# Patient Record
Sex: Female | Born: 1965 | Race: White | Hispanic: No | Marital: Married | Smoking: Current every day smoker
Health system: Southern US, Community
[De-identification: ages and names within clinical notes are randomized; demographics above are authoritative.]

## PROBLEM LIST (undated history)

## (undated) DIAGNOSIS — I10 Essential (primary) hypertension: Secondary | ICD-10-CM

## (undated) DIAGNOSIS — I219 Acute myocardial infarction, unspecified: Secondary | ICD-10-CM

## (undated) DIAGNOSIS — K219 Gastro-esophageal reflux disease without esophagitis: Secondary | ICD-10-CM

## (undated) DIAGNOSIS — E119 Type 2 diabetes mellitus without complications: Secondary | ICD-10-CM

## (undated) DIAGNOSIS — K8020 Calculus of gallbladder without cholecystitis without obstruction: Secondary | ICD-10-CM

## (undated) HISTORY — PX: WISDOM TOOTH EXTRACTION: SHX21

## (undated) HISTORY — DX: Acute myocardial infarction, unspecified: I21.9

## (undated) HISTORY — DX: Type 2 diabetes mellitus without complications: E11.9

---

## 2004-12-05 ENCOUNTER — Emergency Department: Payer: Self-pay | Admitting: Emergency Medicine

## 2005-05-27 ENCOUNTER — Ambulatory Visit: Payer: Self-pay | Admitting: Obstetrics and Gynecology

## 2005-10-28 ENCOUNTER — Ambulatory Visit: Payer: Self-pay | Admitting: Internal Medicine

## 2005-11-28 ENCOUNTER — Ambulatory Visit: Payer: Self-pay | Admitting: Internal Medicine

## 2006-01-03 ENCOUNTER — Ambulatory Visit: Payer: Self-pay | Admitting: Internal Medicine

## 2006-01-28 ENCOUNTER — Ambulatory Visit: Payer: Self-pay | Admitting: Internal Medicine

## 2006-03-04 ENCOUNTER — Ambulatory Visit: Payer: Self-pay | Admitting: Internal Medicine

## 2006-03-30 ENCOUNTER — Ambulatory Visit: Payer: Self-pay | Admitting: Internal Medicine

## 2008-08-14 ENCOUNTER — Ambulatory Visit: Payer: Self-pay | Admitting: Family Medicine

## 2009-11-17 ENCOUNTER — Emergency Department: Payer: Self-pay | Admitting: Emergency Medicine

## 2009-11-21 ENCOUNTER — Emergency Department: Payer: Self-pay | Admitting: Emergency Medicine

## 2009-11-24 ENCOUNTER — Emergency Department: Payer: Self-pay | Admitting: Emergency Medicine

## 2010-07-08 ENCOUNTER — Ambulatory Visit: Payer: Self-pay | Admitting: Internal Medicine

## 2010-07-20 ENCOUNTER — Ambulatory Visit: Payer: Self-pay | Admitting: Internal Medicine

## 2010-07-30 ENCOUNTER — Ambulatory Visit: Payer: Self-pay | Admitting: Internal Medicine

## 2010-08-30 ENCOUNTER — Ambulatory Visit: Payer: Self-pay | Admitting: Internal Medicine

## 2011-11-09 ENCOUNTER — Ambulatory Visit: Payer: Self-pay | Admitting: Sports Medicine

## 2013-01-21 ENCOUNTER — Emergency Department: Payer: Self-pay | Admitting: Emergency Medicine

## 2016-05-18 ENCOUNTER — Encounter: Payer: Self-pay | Admitting: *Deleted

## 2016-05-27 ENCOUNTER — Encounter: Payer: Self-pay | Admitting: *Deleted

## 2016-06-03 ENCOUNTER — Ambulatory Visit: Payer: Self-pay | Admitting: General Surgery

## 2016-06-24 ENCOUNTER — Encounter: Payer: Self-pay | Admitting: *Deleted

## 2019-07-10 ENCOUNTER — Ambulatory Visit (INDEPENDENT_AMBULATORY_CARE_PROVIDER_SITE_OTHER): Payer: BC Managed Care – PPO | Admitting: Obstetrics & Gynecology

## 2019-07-10 ENCOUNTER — Other Ambulatory Visit (HOSPITAL_COMMUNITY)
Admission: RE | Admit: 2019-07-10 | Discharge: 2019-07-10 | Disposition: A | Discharge status: Home / Self Care | Payer: BC Managed Care – PPO | Source: Ambulatory Visit | Attending: Obstetrics & Gynecology | Admitting: Obstetrics & Gynecology

## 2019-07-10 ENCOUNTER — Encounter: Payer: Self-pay | Admitting: Obstetrics & Gynecology

## 2019-07-10 ENCOUNTER — Other Ambulatory Visit: Payer: Self-pay

## 2019-07-10 VITALS — BP 150/90 | Ht 65.0 in | Wt 184.0 lb

## 2019-07-10 DIAGNOSIS — Z1211 Encounter for screening for malignant neoplasm of colon: Secondary | ICD-10-CM

## 2019-07-10 DIAGNOSIS — Z01419 Encounter for gynecological examination (general) (routine) without abnormal findings: Secondary | ICD-10-CM | POA: Diagnosis not present

## 2019-07-10 DIAGNOSIS — Z23 Encounter for immunization: Secondary | ICD-10-CM

## 2019-07-10 DIAGNOSIS — Z1231 Encounter for screening mammogram for malignant neoplasm of breast: Secondary | ICD-10-CM

## 2019-07-10 DIAGNOSIS — Z124 Encounter for screening for malignant neoplasm of cervix: Secondary | ICD-10-CM

## 2019-07-10 DIAGNOSIS — N952 Postmenopausal atrophic vaginitis: Secondary | ICD-10-CM | POA: Insufficient documentation

## 2019-07-10 DIAGNOSIS — N9419 Other specified dyspareunia: Secondary | ICD-10-CM | POA: Insufficient documentation

## 2019-07-10 NOTE — Patient Instructions (Addendum)
PAP every three years    Done today Mammogram every year    Call [phone] to schedule at [facility] Colonoscopy every 10 years    Someone will call to schedule consult Labs yearly (with PCP)   Atrophic Vaginitis  Atrophic vaginitis is a condition in which the tissues that line the vagina become dry and thin. This condition is most common in women who have stopped having regular menstrual periods (are in menopause). This usually starts when a woman is 61-68 years old. That is the time when a woman's estrogen levels begin to drop (decrease). Estrogen is a female hormone. It helps to keep the tissues of the vagina moist. It stimulates the vagina to produce a clear fluid that lubricates the vagina for sexual intercourse. This fluid also protects the vagina from infection. Lack of estrogen can cause the lining of the vagina to get thinner and dryer. The vagina may also shrink in size. It may become less elastic. Atrophic vaginitis tends to get worse over time as a woman's estrogen level drops. What are the causes? This condition is caused by the normal drop in estrogen that happens around the time of menopause. What increases the risk? Certain conditions or situations may lower a woman's estrogen level, leading to a higher risk for atrophic vaginitis. You are more likely to develop this condition if:  You are taking medicines that block estrogen.  You have had your ovaries removed.  You are being treated for cancer with X-ray (radiation) or medicines (chemotherapy).  You have given birth or are breastfeeding.  You are older than age 66.  You smoke. What are the signs or symptoms? Symptoms of this condition include:  Pain, soreness, or bleeding during sexual intercourse (dyspareunia).  Vaginal burning, irritation, or itching.  Pain or bleeding when a speculum is used in a vaginal exam (pelvic exam).  Having burning pain when passing urine.  Vaginal discharge that is brown or  yellow. In some cases, there are no symptoms. How is this diagnosed? This condition is diagnosed by taking a medical history and doing a physical exam. This will include a pelvic exam that checks the vaginal tissues. Though rare, you may also have other tests, including:  A urine test.  A test that checks the acid balance in your vagina (acid balance test). How is this treated? Treatment for this condition depends on how severe your symptoms are. Treatment may include:  Using an over-the-counter vaginal lubricant before sex.  Using a long-acting vaginal moisturizer. REPLENS  Using low-dose vaginal estrogen for moderate to severe symptoms that do not respond to other treatments. Options include creams, tablets, and inserts (vaginal rings). Before you use a vaginal estrogen, tell your health care provider if you have a history of: ? Breast cancer. ? Endometrial cancer. ? Blood clots. If you are not sexually active and your symptoms are very mild, you may not need treatment. Follow these instructions at home: Medicines  Take over-the-counter and prescription medicines only as told by your health care provider. Do not use herbal or alternative medicines unless your health care provider says that you can.  Use over-the-counter creams, lubricants, or moisturizers for dryness only as directed by your health care provider. General instructions  If your atrophic vaginitis is caused by menopause, discuss all of your menopause symptoms and treatment options with your health care provider.  Do not douche.  Do not use products that can make your vagina dry. These include: ? Scented feminine sprays. ? Scented  tampons. ? Scented soaps.  Vaginal intercourse can help to improve blood flow and elasticity of vaginal tissue. If it hurts to have sex, try using a lubricant or moisturizer just before having intercourse. Contact a health care provider if:  Your discharge looks different than  normal.  Your vagina has an unusual smell.  You have new symptoms.  Your symptoms do not improve with treatment.  Your symptoms get worse. Summary  Atrophic vaginitis is a condition in which the tissues that line the vagina become dry and thin. It is most common in women who have stopped having regular menstrual periods (are in menopause).  Treatment options include using vaginal lubricants and low-dose vaginal estrogen.  Contact a health care provider if your vagina has an unusual smell, or if your symptoms get worse or do not improve after treatment. This information is not intended to replace advice given to you by your health care provider. Make sure you discuss any questions you have with your health care provider. Document Released: 12/31/2014 Document Revised: 07/29/2017 Document Reviewed: 05/12/2017 Elsevier Patient Education  2020 [employer].

## 2019-07-10 NOTE — Progress Notes (Signed)
HPI:      [name] is a 53 y.o. who LMP was > 1 year ago, she presents today for her annual examination.  The patient has no complaints today other than developing dyspareunia for the past year especially vaginally and w entry.  Denies hot flashes or night sweats. The patient is sexually active. Herlast pap: was normal and this was years ago; and last mammogram: approximate date 2009 and was normal.  The patient does perform self breast exams.  There is no notable family history of breast or ovarian cancer in her family. The patient is not taking hormone replacement therapy. Patient denies post-menopausal vaginal bleeding.   The patient has regular exercise: yes. The patient denies current symptoms of depression.    GYN Hx: Last Colonoscopy:never     PMHx: Past Medical History:  Diagnosis Date  . Diabetes [facility])    Past Surgical History:  Procedure Laterality Date  . CESAREAN SECTION    . WISDOM TOOTH EXTRACTION     Family History  Family history unknown: Yes   Social History   Tobacco Use  . Smoking status: Current Every Day Smoker  . Smokeless tobacco: Never Used  Substance Use Topics  . Alcohol use: Never    Frequency: Never  . Drug use: Never    Current Outpatient Medications:  .  metFORMIN (GLUCOPHAGE) 850 MG tablet, Take 850 mg by mouth 2 (two) times daily with a meal., Disp: , Rfl:  Allergies: Patient has no allergy information on record.  Review of Systems  Constitutional: Negative for chills, fever and malaise/fatigue.  HENT: Negative for congestion, sinus pain and sore throat.   Eyes: Negative for blurred vision and pain.  Respiratory: Negative for cough and wheezing.   Cardiovascular: Negative for chest pain and leg swelling.  Gastrointestinal: Negative for abdominal pain, constipation, diarrhea, heartburn, nausea and vomiting.  Genitourinary: Negative for dysuria, frequency, hematuria and urgency.  Musculoskeletal: Negative for back pain, joint pain,  myalgias and neck pain.  Skin: Negative for itching and rash.  Neurological: Negative for dizziness, tremors and weakness.  Endo/Heme/Allergies: Does not bruise/bleed easily.  Psychiatric/Behavioral: Negative for depression. The patient is not nervous/anxious and does not have insomnia.     Objective: BP (!) 150/90   Ht 5\' 5"  (1.651 m)   Wt 184 lb (83.5 kg)   BMI 30.62 kg/m   Filed Weights   07/10/19 1013  Weight: 184 lb (83.5 kg)   Body mass index is 30.62 kg/m. Physical Exam Constitutional:      General: She is not in acute distress.    Appearance: She is well-developed.  Genitourinary:     Pelvic exam was performed with patient supine.     Vagina, uterus and rectum normal.     No lesions in the vagina.     No vaginal bleeding.     No cervical motion tenderness, friability, lesion or polyp.     Uterus is mobile.     Uterus is not enlarged.     No uterine mass detected.    Uterus is midaxial.     No right or left adnexal mass present.     Right adnexa not tender.     Left adnexa not tender.  HENT:     Head: Normocephalic and atraumatic. No laceration.     Right Ear: Hearing normal.     Left Ear: Hearing normal.     Mouth/Throat:     Pharynx: Uvula midline.  Eyes:  Pupils: Pupils are equal, round, and reactive to light.  Neck:     Musculoskeletal: Normal range of motion and neck supple.     Thyroid: No thyromegaly.  Cardiovascular:     Rate and Rhythm: Normal rate and regular rhythm.     Heart sounds: No murmur. No friction rub. No gallop.   Pulmonary:     Effort: Pulmonary effort is normal. No respiratory distress.     Breath sounds: Normal breath sounds. No wheezing.  Chest:     Breasts:        Right: No mass, skin change or tenderness.        Left: No mass, skin change or tenderness.  Abdominal:     General: Bowel sounds are normal. There is no distension.     Palpations: Abdomen is soft.     Tenderness: There is no abdominal tenderness. There is no  rebound.  Musculoskeletal: Normal range of motion.  Neurological:     Mental Status: She is alert and oriented to person, place, and time.     Cranial Nerves: No cranial nerve deficit.  Skin:    General: Skin is warm and dry.  Psychiatric:        Judgment: Judgment normal.  Vitals signs reviewed.     Assessment: Annual Exam 1. Women's annual routine gynecological examination   2. Screening for cervical cancer   3. Screen for colon cancer   4. Vaginal atrophy   5. Dyspareunia due to medical condition in female   6. Encounter for screening mammogram for malignant neoplasm of breast     Plan:            1.  Cervical Screening-  Pap smear done today Also monitor PAP results for infection  2. Breast screening- Exam annually and mammogram scheduled  3. Colonoscopy every 10 years, Hemoccult testing after age 25  4. Labs managed by PCP  5. Counseling for hormonal therapy: none Will try Replens for vag dryness and dyspareunia, options discussed also as to ERT and [place] and [place]              6. FRAX - FRAX score for assessing the 10 year probability for fracture calculated and discussed today.  Based on age and score today, DEXA is not currently scheduled.   7. Flu shot today    F/U  Return in about 1 year (around 07/09/2020) for Annual.  Barnett Applebaum, MD, [facility], [facility] 07/10/2019  10:35 AM

## 2019-07-13 LAB — CYTOLOGY - PAP
Adequacy: ABSENT
Comment: NEGATIVE
Diagnosis: NEGATIVE
High risk HPV: NEGATIVE

## 2019-07-20 ENCOUNTER — Encounter: Payer: Self-pay | Admitting: *Deleted

## 2019-09-03 ENCOUNTER — Other Ambulatory Visit: Payer: Self-pay | Admitting: Physical Medicine and Rehabilitation

## 2019-09-03 DIAGNOSIS — G8929 Other chronic pain: Secondary | ICD-10-CM

## 2019-09-03 DIAGNOSIS — M25511 Pain in right shoulder: Secondary | ICD-10-CM

## 2019-09-10 ENCOUNTER — Other Ambulatory Visit: Payer: Self-pay

## 2019-09-10 ENCOUNTER — Ambulatory Visit
Admission: RE | Admit: 2019-09-10 | Discharge: 2019-09-10 | Disposition: A | Discharge status: Home / Self Care | Payer: BC Managed Care – PPO | Source: Ambulatory Visit | Attending: Physical Medicine and Rehabilitation | Admitting: Physical Medicine and Rehabilitation

## 2019-09-10 DIAGNOSIS — M25511 Pain in right shoulder: Secondary | ICD-10-CM | POA: Insufficient documentation

## 2019-09-10 DIAGNOSIS — G8929 Other chronic pain: Secondary | ICD-10-CM | POA: Insufficient documentation

## 2019-10-08 ENCOUNTER — Other Ambulatory Visit: Payer: Self-pay | Admitting: Obstetrics & Gynecology

## 2019-10-08 ENCOUNTER — Telehealth: Payer: Self-pay

## 2019-10-08 NOTE — Telephone Encounter (Signed)
Pt aware to schedule , she wasn't sure where to schedule it at,

## 2019-10-08 NOTE — Telephone Encounter (Signed)
-----   Message from Nadara Mustard, MD sent at 10/08/2019  9:07 AM EST ----- Regarding: MMG Received notice she has not received MMG yet as ordered at her Annual. Please check and encourage her to do this, and document conversation.

## 2020-01-04 ENCOUNTER — Other Ambulatory Visit: Payer: Self-pay | Admitting: Obstetrics & Gynecology

## 2020-01-08 ENCOUNTER — Telehealth: Payer: Self-pay

## 2020-01-08 NOTE — Telephone Encounter (Signed)
-----   Message from Nadara Mustard, MD sent at 01/04/2020  3:24 PM EDT ----- Regarding: 2nd reminder MMG Received notice she has not received MMG yet as ordered at her Annual as well as with message sent to her in February 2021. Please check and encourage her to do this, and document conversation.

## 2020-01-08 NOTE — Telephone Encounter (Signed)
Pt states she will try to get her mammo scheduled she just does not have a lot of time.

## 2020-07-05 ENCOUNTER — Other Ambulatory Visit: Payer: Self-pay | Admitting: Obstetrics & Gynecology

## 2020-07-05 DIAGNOSIS — Z1231 Encounter for screening mammogram for malignant neoplasm of breast: Secondary | ICD-10-CM

## 2020-07-07 ENCOUNTER — Telehealth: Payer: Self-pay | Admitting: Obstetrics & Gynecology

## 2020-07-07 NOTE — Telephone Encounter (Signed)
-----   Message from Nadara Mustard, MD sent at 07/05/2020 10:01 AM EDT ----- Regarding: sch Annual

## 2020-07-07 NOTE — Telephone Encounter (Signed)
Phone number on file is the wrong number

## 2021-04-07 ENCOUNTER — Emergency Department: Payer: Managed Care, Other (non HMO)

## 2021-04-07 ENCOUNTER — Emergency Department
Admission: EM | Admit: 2021-04-07 | Discharge: 2021-04-07 | Disposition: A | Discharge status: Home / Self Care | Payer: Managed Care, Other (non HMO) | Attending: Emergency Medicine | Admitting: Emergency Medicine

## 2021-04-07 ENCOUNTER — Other Ambulatory Visit: Payer: Self-pay

## 2021-04-07 DIAGNOSIS — R1011 Right upper quadrant pain: Secondary | ICD-10-CM | POA: Diagnosis present

## 2021-04-07 DIAGNOSIS — Z7984 Long term (current) use of oral hypoglycemic drugs: Secondary | ICD-10-CM | POA: Insufficient documentation

## 2021-04-07 DIAGNOSIS — F17200 Nicotine dependence, unspecified, uncomplicated: Secondary | ICD-10-CM | POA: Diagnosis not present

## 2021-04-07 DIAGNOSIS — E1165 Type 2 diabetes mellitus with hyperglycemia: Secondary | ICD-10-CM | POA: Diagnosis not present

## 2021-04-07 DIAGNOSIS — D72829 Elevated white blood cell count, unspecified: Secondary | ICD-10-CM | POA: Diagnosis not present

## 2021-04-07 DIAGNOSIS — K8070 Calculus of gallbladder and bile duct without cholecystitis without obstruction: Secondary | ICD-10-CM | POA: Insufficient documentation

## 2021-04-07 DIAGNOSIS — R739 Hyperglycemia, unspecified: Secondary | ICD-10-CM

## 2021-04-07 DIAGNOSIS — K8020 Calculus of gallbladder without cholecystitis without obstruction: Secondary | ICD-10-CM

## 2021-04-07 LAB — URINALYSIS, COMPLETE (UACMP) WITH MICROSCOPIC
Bilirubin Urine: NEGATIVE
Glucose, UA: >=500 mg/dL — AB
Hgb urine dipstick: NEGATIVE
Ketones, ur: NEGATIVE mg/dL
Leukocytes,Ua: NEGATIVE
Nitrite: NEGATIVE
Protein, ur: 30 mg/dL — AB
Specific Gravity, Urine: 1.02 (ref 1.005–1.030)
pH: 6 (ref 5.0–8.0)

## 2021-04-07 LAB — CBC
HCT: 45.2 % (ref 36.0–46.0)
Hemoglobin: 15.7 g/dL — ABNORMAL HIGH (ref 12.0–15.0)
MCH: 29.9 pg (ref 26.0–34.0)
MCHC: 34.7 g/dL (ref 30.0–36.0)
MCV: 86.1 fL (ref 80.0–100.0)
Platelets: 419 10*3/uL — ABNORMAL HIGH (ref 150–400)
RBC: 5.25 MIL/uL — ABNORMAL HIGH (ref 3.87–5.11)
RDW: 13.2 % (ref 11.5–15.5)
WBC: 11.4 10*3/uL — ABNORMAL HIGH (ref 4.0–10.5)
nRBC: 0 % (ref 0.0–0.2)

## 2021-04-07 LAB — COMPREHENSIVE METABOLIC PANEL
ALT: 23 U/L (ref 0–44)
AST: 18 U/L (ref 15–41)
Albumin: 3.8 g/dL (ref 3.5–5.0)
Alkaline Phosphatase: 92 U/L (ref 38–126)
Anion gap: 12 (ref 5–15)
BUN: 11 mg/dL (ref 6–20)
CO2: 24 mmol/L (ref 22–32)
Calcium: 9.2 mg/dL (ref 8.9–10.3)
Chloride: 98 mmol/L (ref 98–111)
Creatinine, Ser: 0.65 mg/dL (ref 0.44–1.00)
GFR, Estimated: >60 mL/min (ref >60)
Glucose, Bld: 365 mg/dL — ABNORMAL HIGH (ref 70–99)
Potassium: 4.1 mmol/L (ref 3.5–5.1)
Sodium: 134 mmol/L — ABNORMAL LOW (ref 135–145)
Total Bilirubin: 0.7 mg/dL (ref 0.3–1.2)
Total Protein: 7.3 g/dL (ref 6.5–8.1)

## 2021-04-07 LAB — LIPASE, BLOOD: Lipase: 77 U/L — ABNORMAL HIGH (ref 11–51)

## 2021-04-07 LAB — CBG MONITORING, ED: Glucose-Capillary: 342 mg/dL — ABNORMAL HIGH (ref 70–99)

## 2021-04-07 LAB — HEMOGLOBIN A1C
Hgb A1c MFr Bld: 10.8 % — ABNORMAL HIGH (ref 4.8–5.6)
Mean Plasma Glucose: 263.26 mg/dL

## 2021-04-07 MED ORDER — MORPHINE SULFATE (PF) 4 MG/ML IV SOLN
6.0000 mg | Freq: Once | INTRAVENOUS | Status: AC
  Administered 2021-04-07: 6 mg via INTRAVENOUS
  Filled 2021-04-07: qty 2

## 2021-04-07 MED ORDER — INSULIN ASPART 100 UNIT/ML IJ SOLN
0.0000 [IU] | INTRAMUSCULAR | Status: DC
  Administered 2021-04-07: 11 [IU] via SUBCUTANEOUS
  Filled 2021-04-07: qty 1

## 2021-04-07 MED ORDER — ONDANSETRON HCL 4 MG PO TABS: 4.0000 mg | ORAL_TABLET | Freq: Three times a day (TID) | ORAL | 0 refills | Status: DC | PRN

## 2021-04-07 MED ORDER — LACTATED RINGERS IV BOLUS
1000.0000 mL | Freq: Once | INTRAVENOUS | Status: AC
  Administered 2021-04-07: 1000 mL via INTRAVENOUS

## 2021-04-07 MED ORDER — OXYCODONE-ACETAMINOPHEN 5-325 MG PO TABS
1.0000 | ORAL_TABLET | Freq: Three times a day (TID) | ORAL | 0 refills | Status: AC | PRN
Start: 2021-04-07 — End: 2021-04-10

## 2021-04-07 MED ORDER — ONDANSETRON HCL 4 MG/2ML IJ SOLN
4.0000 mg | Freq: Once | INTRAMUSCULAR | Status: AC
  Administered 2021-04-07: 4 mg via INTRAVENOUS
  Filled 2021-04-07: qty 2

## 2021-04-07 NOTE — ED Triage Notes (Signed)
Pt to ED sent from [facility] for gallstone. Reports epigastric pain that started a week ago. +nausea

## 2021-04-07 NOTE — ED Provider Notes (Signed)
[facility] Emergency Department Provider Note  ____________________________________________   Event Date/Time   First MD Initiated Contact with Patient 04/07/21 0915     (approximate)  I have reviewed the triage vital signs and the nursing notes.   HISTORY  Chief Complaint Abdominal Pain   HPI Lauren Whitney is a 55 y.o. female with past medical history of diabetes who presents as being referred to the ED from renal clinic for assessment of some epigastric and right upper quadrant pain associate with nausea.  Patient states has been ongoing intermittent over the last week but seems of gotten much worse over the last day or so.  She states it is precipitated and exacerbated by eating.  She denies any significant EtOH or NSAID use.  He denies any vomiting but does endorse a little bit of nonbloody diarrhea.  She denies any urinary symptoms, lower abdominal pain or back pain.  Denies any chest pain, cough, shortness of breath, headache, earache, sore throat, rash or recent injuries or falls.  No other acute concerns at this time.         Past Medical History:  Diagnosis Date   Diabetes [facility])     Patient Active Problem List   Diagnosis Date Noted   Vaginal atrophy 07/10/2019   Dyspareunia due to medical condition in female 07/10/2019    Past Surgical History:  Procedure Laterality Date   CESAREAN SECTION     WISDOM TOOTH EXTRACTION      Prior to Admission medications   Medication Sig Start Date End Date Taking? Authorizing Provider  ondansetron (ZOFRAN) 4 MG tablet Take 1 tablet (4 mg total) by mouth every 8 (eight) hours as needed for up to 10 doses for nausea or vomiting. 04/07/21  Yes Gilles Chiquito, MD  oxyCODONE-acetaminophen (PERCOCET) 5-325 MG tablet Take 1 tablet by mouth every 8 (eight) hours as needed for up to 3 days for severe pain. 04/07/21 04/10/21 Yes Gilles Chiquito, MD  metFORMIN (GLUCOPHAGE) 850 MG tablet Take 850 mg by mouth 2  (two) times daily with a meal.    [provider]    Allergies Patient has no allergy information on record.  Family History  Family history unknown: Yes    Social History Social History   Tobacco Use   Smoking status: Every Day   Smokeless tobacco: Never  Vaping Use   Vaping Use: Never used  Substance Use Topics   Alcohol use: Never   Drug use: Never    Review of Systems  Review of Systems  Constitutional:  Negative for chills and fever.  HENT:  Negative for sore throat.   Eyes:  Negative for pain.  Respiratory:  Negative for cough and stridor.   Cardiovascular:  Negative for chest pain.  Gastrointestinal:  Positive for abdominal pain, diarrhea and nausea. Negative for vomiting.  Genitourinary:  Negative for dysuria.  Musculoskeletal:  Negative for myalgias.  Skin:  Negative for rash.  Neurological:  Negative for seizures, loss of consciousness and headaches.  Psychiatric/Behavioral:  Negative for suicidal ideas.   All other systems reviewed and are negative.    ____________________________________________   PHYSICAL EXAM:  VITAL SIGNS: ED Triage Vitals [04/07/21 0837]  Enc Vitals Group     BP (!) 156/72     Pulse Rate 95     Resp 18     Temp 98.5 F (36.9 C)     Temp Source Oral     SpO2 96 %  Weight 180 lb (81.6 kg)     Height 5\' 5"  (1.651 m)     Head Circumference      Peak Flow      Pain Score 6     Pain Loc      Pain Edu?      Excl. in GC?    Vitals:   04/07/21 1015 04/07/21 1030  BP:  (!) 142/69  Pulse: 88 73  Resp:    Temp:    SpO2: 96% 96%   Physical Exam Vitals and nursing note reviewed.  Constitutional:      General: She is not in acute distress.    Appearance: She is well-developed.  HENT:     Head: Normocephalic and atraumatic.     Right Ear: External ear normal.     Left Ear: External ear normal.     Nose: Nose normal.  Eyes:     Conjunctiva/sclera: Conjunctivae normal.  Cardiovascular:     Rate and Rhythm:  Normal rate and regular rhythm.     Heart sounds: No murmur heard. Pulmonary:     Effort: Pulmonary effort is normal. No respiratory distress.     Breath sounds: Normal breath sounds.  Abdominal:     Palpations: Abdomen is soft.     Tenderness: There is abdominal tenderness in the right upper quadrant and epigastric area. There is no right CVA tenderness or left CVA tenderness.  Musculoskeletal:     Cervical back: Neck supple.  Skin:    General: Skin is warm and dry.     Capillary Refill: Capillary refill takes less than 2 seconds.  Neurological:     Mental Status: She is alert and oriented to person, place, and time.  Psychiatric:        Mood and Affect: Mood normal.     ____________________________________________   LABS (all labs ordered are listed, but only abnormal results are displayed)  Labs Reviewed  LIPASE, BLOOD - Abnormal; Notable for the following components:      Result Value   Lipase 77 (*)    All other components within normal limits  COMPREHENSIVE METABOLIC PANEL - Abnormal; Notable for the following components:   Sodium 134 (*)    Glucose, Bld 365 (*)    All other components within normal limits  CBC - Abnormal; Notable for the following components:   WBC 11.4 (*)    RBC 5.25 (*)    Hemoglobin 15.7 (*)    Platelets 419 (*)    All other components within normal limits  URINALYSIS, COMPLETE (UACMP) WITH MICROSCOPIC - Abnormal; Notable for the following components:   Color, Urine YELLOW (*)    APPearance HAZY (*)    Glucose, UA >=500 (*)    Protein, ur 30 (*)    Bacteria, UA RARE (*)    All other components within normal limits  CBG MONITORING, ED - Abnormal; Notable for the following components:   Glucose-Capillary 342 (*)    All other components within normal limits  HEMOGLOBIN A1C   ____________________________________________  EKG Sinus rhythm with a ventricular rate of 93, normal axis, unremarkable intervals without clearance of acute ischemia  or significant arrhythmia. ____________________________________________  RADIOLOGY  ED MD interpretation: Right upper quadrant ultrasound remarkable for cholelithiasis without evidence of cholecystitis.  There is some mild hepatic steatosis.  No evidence of portal vein thrombosis or dilated common bile duct.  Official radiology report(s): 06/07/21 ABDOMEN LIMITED RUQ (LIVER/GB)  Result Date: 04/07/2021 CLINICAL DATA:  Acute right  upper quadrant abdominal pain. EXAM: ULTRASOUND ABDOMEN LIMITED RIGHT UPPER QUADRANT COMPARISON:  None. FINDINGS: Gallbladder: Cholelithiasis is noted without gallbladder wall thickening or pericholecystic fluid. Largest calculus measures 1.5 cm. No sonographic Murphy's sign is noted. Common bile duct: Diameter: 3 mm which is within normal limits. Liver: No focal lesion identified. Increased echogenicity of hepatic parenchyma is noted suggesting hepatic steatosis. Portal vein is patent on color Doppler imaging with normal direction of blood flow towards the liver. Other: None. IMPRESSION: Cholelithiasis without cholecystitis.  Hepatic steatosis. Electronically Signed   By: Lupita Raider M.D.   On: 04/07/2021 10:12    ____________________________________________   PROCEDURES  Procedure(s) performed (including Critical Care):  Procedures   ____________________________________________   INITIAL IMPRESSION / ASSESSMENT AND PLAN / ED COURSE      Patient presents with above-stated history exam for assessment of intermittent progressively worsening right upper quadrant abdominal pain.  This is associate with some nausea and diarrhea.  On arrival she is afebrile hemodynamically stable.  She is mildly tender in her epigastrium and right upper quadrant.  She also appears slightly dehydrated.  Primary differential includes acute cholecystitis, pancreatitis, gastroenteritis, cystitis and duodenitis.  CMP remarkable for glucose of 365 without any evidence of acidosis.  Normal  anion gap.  No other significant electrolyte or metabolic derangements.  CBC shows mild leukocytosis with WBC count of 11.4.  However patient has no fever, tachycardia, tachypnea or findings on ultrasound noted below to suggest acute infectious process.  UA has no convincing evidence of infection and has some rare bacteria and 30 protein.  Right upper quadrant ultrasound remarkable for cholelithiasis without evidence of cholecystitis.  There is some mild hepatic steatosis.  No evidence of portal vein thrombosis or dilated common bile duct.  Lipase not consistent with acute pancreatitis.  Suspect likely symptomatic cholelithiasis.  On my assessment after below noted analgesia and Zofran given patient states she feeling much better.  Given absence of evidence of cholecystitis and low suspicion for acute infection i.e. cholangitis and low suspicion for cholestasis or other immediate life-threatening pathology I think patient is stable for discharge with close outpatient follow-up.  Discussed with on-call surgeon Dr. Aleen Campi who said he can see patient in his clinic tomorrow.  Advised patient of this.  Will write short course of analgesia and antiemetics.  Discharged stable condition.  Strict return precautions advised and discussed.  Discussed with patient elevated sugars today and importance of following up with her PCP to ensure they are well managed.  However it is possible this is related to stress and pain from gallstones.       ____________________________________________   FINAL CLINICAL IMPRESSION(S) / ED DIAGNOSES  Final diagnoses:  RUQ pain  Calculus of gallbladder without cholecystitis without obstruction  Hyperglycemia    Medications  insulin aspart (novoLOG) injection 0-15 Units (11 Units Subcutaneous Given 04/07/21 1043)  lactated ringers bolus 1,000 mL (1,000 mLs Intravenous New Bag/Given 04/07/21 1029)  ondansetron (ZOFRAN) injection 4 mg (4 mg Intravenous Given 04/07/21 1029)   morphine 4 MG/ML injection 6 mg (6 mg Intravenous Given 04/07/21 1030)     ED Discharge Orders          Ordered    oxyCODONE-acetaminophen (PERCOCET) 5-325 MG tablet  Every 8 hours PRN        04/07/21 1048    ondansetron (ZOFRAN) 4 MG tablet  Every 8 hours PRN        04/07/21 1048  Note:  This document was prepared using Dragon voice recognition software and may include unintentional dictation errors.    Gilles ChiquitoSmith, Matika Bartell P, MD 04/07/21 1052

## 2021-04-08 ENCOUNTER — Ambulatory Visit: Payer: Managed Care, Other (non HMO) | Admitting: Surgery

## 2021-04-08 ENCOUNTER — Telehealth: Payer: Self-pay | Admitting: Surgery

## 2021-04-08 ENCOUNTER — Encounter: Payer: Self-pay | Admitting: Surgery

## 2021-04-08 VITALS — BP 146/89 | HR 97 | Temp 98.8°F | Ht 65.0 in | Wt 188.2 lb

## 2021-04-08 DIAGNOSIS — K8020 Calculus of gallbladder without cholecystitis without obstruction: Secondary | ICD-10-CM | POA: Diagnosis not present

## 2021-04-08 MED ORDER — OXYCODONE HCL 5 MG PO TABS: 5.0000 mg | ORAL_TABLET | Freq: Four times a day (QID) | ORAL | 0 refills | Status: DC | PRN

## 2021-04-08 NOTE — Patient Instructions (Signed)
Our surgery scheduler Barbara will call you within 24-48 hours to get you scheduled. If you have not heard from her after 48 hours, please call our office. You will not need to get Covid tested before surgery and have the blue sheet available when she calls to write down important information.   If you have any concerns or questions, please feel free to call our office.   Minimally Invasive Cholecystectomy  Minimally invasive cholecystectomy is surgery to remove the gallbladder. The gallbladder is a pear-shaped organ that lies beneath the liver on the right side of the body. The gallbladder stores bile, which is a fluid that helps the body digest fats. Cholecystectomy is often done to treat inflammation of the gallbladder (cholecystitis). This condition is usually caused by a buildup of gallstones (cholelithiasis) in the gallbladder. Gallstones can block the flow of bile, which can result in inflammation and pain. In severe cases, emergency surgery may be required. This procedure is done though small incisions in the abdomen, instead of one large incision. It is also called laparoscopic surgery. A thin scope with a camera (laparoscope) is inserted through one incision. Then surgical instruments are inserted through the other incisions. In some cases, a minimally invasive surgery may need to be changed to a surgery that is done through a larger incision. This is called open surgery. Tell a health care provider about: Any allergies you have. All medicines you are taking, including vitamins, herbs, eye drops, creams, and over-the-counter medicines. Any problems you or family members have had with anesthetic medicines. Any blood disorders you have. Any surgeries you have had. Any medical conditions you have. Whether you are pregnant or may be pregnant. What are the risks? Generally, this is a safe procedure. However, problems may occur, including: Infection. Bleeding. Allergic reactions to  medicines. Damage to nearby structures or organs. A stone remaining in the common bile duct. The common bile duct carries bile from the gallbladder into the small intestine. A bile leak from the cyst duct that is clipped when your gallbladder is removed. What happens before the procedure? Staying hydrated Follow instructions from your health care provider about hydration, which may include: Up to 2 hours before the procedure - you may continue to drink clear liquids, such as water, clear fruit juice, black coffee, and plain tea.  Eating and drinking restrictions Follow instructions from your health care provider about eating and drinking, which may include: 8 hours before the procedure - stop eating heavy meals or foods, such as meat, fried foods, or fatty foods. 6 hours before the procedure - stop eating light meals or foods, such as toast or cereal. 6 hours before the procedure - stop drinking milk or drinks that contain milk. 2 hours before the procedure - stop drinking clear liquids. Medicines Ask your health care provider about: Changing or stopping your regular medicines. This is especially important if you are taking diabetes medicines or blood thinners. Taking medicines such as aspirin and ibuprofen. These medicines can thin your blood. Do not take these medicines unless your health care provider tells you to take them. Taking over-the-counter medicines, vitamins, herbs, and supplements. General instructions Let your health care provider know if you develop a cold or an infection before surgery. Plan to have someone take you home from the hospital or clinic. If you will be going home right after the procedure, plan to have someone with you for 24 hours. Ask your health care provider: How your surgery site will be marked.   taken to help prevent infection. These may include: Removing hair at the surgery site. Washing skin with a germ-killing soap. Taking  antibiotic medicine. What happens during the procedure?  An IV will be inserted into one of your veins. You will be given one or both of the following: A medicine to help you relax (sedative). A medicine to make you fall asleep (general anesthetic). A breathing tube will be placed in your mouth. Your surgeon will make several small incisions in your abdomen. The laparoscope will be inserted through one of the small incisions. The camera on the laparoscope will send images to a monitor in the operating room. This lets your surgeon see inside your abdomen. A gas will be pumped into your abdomen. This will expand your abdomen to give the surgeon more room to perform the surgery. Other tools that are needed for the procedure will be inserted through the other incisions. The gallbladder will be removed through one of the incisions. Your common bile duct may be examined. If stones are found in the common bile duct, they may be removed. After your gallbladder has been removed, the incisions will be closed with stitches (sutures), staples, or skin glue. Your incisions may be covered with a bandage (dressing). The procedure may vary among health care providers and hospitals. What happens after the procedure? Your blood pressure, heart rate, breathing rate, and blood oxygen level will be monitored until you leave the hospital or clinic. You will be given medicines as needed to control your pain. If you were given a sedative during the procedure, it can affect you for several hours. Do not drive or operate machinery until your health care provider says that it is safe. Summary Minimally invasive cholecystectomy, also called laparoscopic cholecystectomy, is surgery to remove the gallbladder using small incisions. Tell your health care provider about all the medical conditions you have and all the medicines you are taking for those conditions. Before the procedure, follow instructions about eating or  drinking restrictions and changing or stopping medicines. If you were given a sedative during the procedure, it can affect you for several hours. Do not drive or operate machinery until your health care provider says that it is safe. This information is not intended to replace advice given to you by your health care provider. Make sure you discuss any questions you have with your healthcare provider. Document Revised: 05/21/2019 Document Reviewed: 05/21/2019 Elsevier Patient Education  2022 Elsevier Inc.   Gallbladder Eating Plan  If you have a gallbladder condition, you may have trouble digesting fats. Eating a low-fat diet can help reduce your symptoms, and may be helpful before and after having surgery to remove your gallbladder (cholecystectomy). Your health care provider may recommend that you work with a diet and nutrition specialist (dietitian) to help you reduce the amount of fat in your diet. What are tips for following this plan? General guidelines Limit your fat intake to less than 30% of your total daily calories. If you eat around 1,800 calories each day, this is less than 60 grams (g) of fat per day. Fat is an important part of a healthy diet. Eating a low-fat diet can make it hard to maintain a healthy body weight. Ask your dietitian how much fat, calories, and other nutrients you need each day. Eat small, frequent meals throughout the day instead of three large meals. Drink at least 8-10 cups of fluid a day. Drink enough fluid to keep your urine clear or pale yellow. Limit  clear or pale yellow. Limit alcohol intake to no more than 1 drink a day for nonpregnant women and 2 drinks a day for men. One drink equals 12 oz of beer, 5 oz of wine, or 1 oz of hard liquor. Reading food labels  Check Nutrition Facts on food labels for the amount of fat per serving. Choose foods with less than 3 grams of fat per serving. Shopping Choose nonfat and low-fat healthy foods. Look for the words "nonfat," "low fat," or "fat  free." Avoid buying processed or prepackaged foods. Cooking Cook using low-fat methods, such as baking, broiling, grilling, or boiling. Cook with small amounts of healthy fats, such as olive oil, grapeseed oil, canola oil, or sunflower oil. What foods are recommended? All fresh, frozen, or canned fruits and vegetables. Whole grains. Low-fat or non-fat (skim) milk and yogurt. Lean meat, skinless poultry, fish, eggs, and beans. Low-fat protein supplement powders or drinks. Spices and herbs. What foods are not recommended? High-fat foods. These include baked goods, fast food, fatty cuts of meat, ice cream, french toast, sweet rolls, pizza, cheese bread, foods covered with butter, creamy sauces, or cheese. Fried foods. These include french fries, tempura, battered fish, breaded chicken, fried breads, and sweets. Foods with strong odors. Foods that cause bloating and gas. Summary A low-fat diet can be helpful if you have a gallbladder condition, or before and after gallbladder surgery. Limit your fat intake to less than 30% of your total daily calories. This is about 60 g of fat if you eat 1,800 calories each day. Eat small, frequent meals throughout the day instead of three large meals. This information is not intended to replace advice given to you by your health care provider. Make sure you discuss any questions you have with your health care provider. Document Revised: 04/03/2020 Document Reviewed: 04/03/2020 Elsevier Patient Education  2022 Elsevier Inc.   

## 2021-04-08 NOTE — H&P (View-Only) (Signed)
04/08/2021  Reason for Visit:  Symptomatic cholelithiasis  History of Present Illness: Lauren Whitney is a 55 y.o. female presenting for evaluation of symptomatic cholelithiasis.  She presented to the ED yesterday with epigastric and RUQ abdominal pain that had been ongoing for the past 10 days.  The patient reported that the pain had worsened the day prior.  She presented to [facility] on 04/06/21 and had labwork which showed a normal LFTs, with elevated lipase of 301 and WBC of 12.3. No ultrasound was done then.  She presented to the ED on 04/07/21 and her labwork had improved with a lipase of 77, still normal LFTs, and WBC of 11.4.  Overall, she reports that there is worsening pain with po intake, although she's able to eat and keep food down.  Has had some nausea but no emesis.  She has tried Tylenol which did not help much, and she got a short prescription for Percocet yesterday which has helped with her pain.  Had not had episodes in the past.  She has history of diabetes which is not well controlled although improving, with last A1c yesterday of 10.8, down from 13.9 in March 2022.  Past Medical History: Past Medical History:  Diagnosis Date   Diabetes ([place])      Past Surgical History: Past Surgical History:  Procedure Laterality Date   CESAREAN SECTION     WISDOM TOOTH EXTRACTION      Home Medications: Prior to Admission medications   Medication Sig Start Date End Date Taking? Authorizing Provider  metFORMIN (GLUCOPHAGE) 850 MG tablet Take 850 mg by mouth 2 (two) times daily with a meal.   Yes [provider]  ondansetron (ZOFRAN) 4 MG tablet Take 1 tablet (4 mg total) by mouth every 8 (eight) hours as needed for up to 10 doses for nausea or vomiting. 04/07/21  Yes Lucrezia Starch, MD  oxyCODONE (ROXICODONE) 5 MG immediate release tablet Take 1 tablet (5 mg total) by mouth every 6 (six) hours as needed for severe pain. 04/09/21 04/09/22 Yes Piscoya, Jacqulyn Bath, MD   oxyCODONE-acetaminophen (PERCOCET) 5-325 MG tablet Take 1 tablet by mouth every 8 (eight) hours as needed for up to 3 days for severe pain. 04/07/21 04/10/21 Yes Lucrezia Starch, MD    Allergies: Not on File  Social History:  reports that she has been smoking. She has never used smokeless tobacco. She reports that she does not drink alcohol and does not use drugs.   Family History: Family History  Family history unknown: Yes    Review of Systems: Review of Systems  Constitutional:  Negative for chills and fever.  Respiratory:  Negative for shortness of breath.   Cardiovascular:  Negative for chest pain.  Gastrointestinal:  Positive for abdominal pain, diarrhea and nausea. Negative for constipation and vomiting.  Genitourinary:  Negative for dysuria.  Musculoskeletal:  Negative for myalgias.  Skin:  Negative for rash.  Neurological:  Negative for dizziness.  Psychiatric/Behavioral:  Negative for depression.    Physical Exam BP (!) 146/89   Pulse 97   Temp 98.8 F (37.1 C) (Oral)   Ht 5' 5" (1.651 m)   Wt 188 lb 3.2 oz (85.4 kg)   SpO2 94%   BMI 31.32 kg/m  CONSTITUTIONAL: No acute distress. HEENT:  Normocephalic, atraumatic, extraocular motion intact. NECK: Trachea is midline, and there is no jugular venous distension.  RESPIRATORY:  Lungs are clear, and breath sounds are equal bilaterally. Normal respiratory effort without pathologic use of  accessory muscles. CARDIOVASCULAR: Heart is regular without murmurs, gallops, or rubs. GI: The abdomen is soft, non-distended, with tenderness to palpation in the RUQ and epigastric areas, no peritonitis, and negative Murphy's sign. MUSCULOSKELETAL:  Normal muscle strength and tone in all four extremities.  No peripheral edema or cyanosis. SKIN: Skin turgor is normal. There are no pathologic skin lesions.  NEUROLOGIC:  Motor and sensation is grossly normal.  Cranial nerves are grossly intact. PSYCH:  Alert and oriented to person, place  and time. Affect is normal.  Laboratory Analysis: Labs from 04/07/21: Na 134, K 4.1, Cl 98, CO2 24, BUN 11, Cr 0.65.  Total bili 0.7, AST 18, ALT 23, Alk Phos 92, lipase 77.  WBC 11.4, Hgb 15.7, Hct 45.2, Plt 419.  Imaging: U/S RUQ 04/07/21: IMPRESSION: Cholelithiasis without cholecystitis.  Hepatic steatosis.  Assessment and Plan: This is a 55 y.o. female with symptomatic cholelithiasis.  --Discussed with the patient the findings in her labwork at [facility] and in the ER, as well as the results of her ultrasound.  Overall the gallbladder is not thickened and there is no pericholecystic fluid.  Her lipase and WBC were improving yesterday so I think it was reasonable to d/c from ER and follow up today in office to schedule.  She's not toxic. --Discussed with her the role for robotic cholecystectomy in her case, and that given her symptoms and her lab findings, I did not recommend conservative measures with low fat diet only.  I think she would benefit from surgery.  She's in agreement.  Discussed with her the surgery at length, including risks of bleeding, infection, injury to surrounding structures, that this is planned as an outpatient surgery but depending on intraop findings, may need to admit overnight, may need a drain, post-op recovery, activity restrictions, and she's willing to proceed.  We would also inject ICG preop to help [place] evaluate the biliary anatomy intraop. --Patient will be scheduled for 04/14/21.  Will also give the patient a short prescription for oxycodone to help her with any additional pain.  Recommended that she can also try Ibuprofen.  However, if her pain is worsening, persistent, gets emesis, or other concerns, should come back to ER sooner.  Face-to-face time spent with the patient and care providers was 60 minutes, with more than 50% of the time spent counseling, educating, and coordinating care of the patient.     Melvyn Neth, [facility]

## 2021-04-08 NOTE — Telephone Encounter (Signed)
Gave patient her information regarding surgery scheduled for 04/14/21.  Patient was given Zofran 4 mg in the ER and has one day left of this.  She is asking for a refill on this please for nausea.  She uses [facility] in [place]. Thank you.

## 2021-04-08 NOTE — Telephone Encounter (Signed)
Patient has been advised of Pre-Admission date/time, COVID Testing date and Surgery date.  Surgery Date: 04/14/21 Preadmission Testing Date: 04/10/21 (phone 1p-5p) Covid Testing Date: Not needed.    Patient has been made aware to call [phone], between 1-3:00pm the day before surgery, to find out what time to arrive for surgery.

## 2021-04-08 NOTE — Progress Notes (Signed)
04/08/2021  Reason for Visit:  Symptomatic cholelithiasis  History of Present Illness: Lauren Whitney is a 55 y.o. female presenting for evaluation of symptomatic cholelithiasis.  She presented to the ED yesterday with epigastric and RUQ abdominal pain that had been ongoing for the past 10 days.  The patient reported that the pain had worsened the day prior.  She presented to [facility] on 04/06/21 and had labwork which showed a normal LFTs, with elevated lipase of 301 and WBC of 12.3. No ultrasound was done then.  She presented to the ED on 04/07/21 and her labwork had improved with a lipase of 77, still normal LFTs, and WBC of 11.4.  Overall, she reports that there is worsening pain with po intake, although she's able to eat and keep food down.  Has had some nausea but no emesis.  She has tried Tylenol which did not help much, and she got a short prescription for Percocet yesterday which has helped with her pain.  Had not had episodes in the past.  She has history of diabetes which is not well controlled although improving, with last A1c yesterday of 10.8, down from 13.9 in March 2022.  Past Medical History: Past Medical History:  Diagnosis Date   Diabetes ([facility])      Past Surgical History: Past Surgical History:  Procedure Laterality Date   CESAREAN SECTION     WISDOM TOOTH EXTRACTION      Home Medications: Prior to Admission medications   Medication Sig Start Date End Date Taking? Authorizing Provider  metFORMIN (GLUCOPHAGE) 850 MG tablet Take 850 mg by mouth 2 (two) times daily with a meal.   Yes [provider]  ondansetron (ZOFRAN) 4 MG tablet Take 1 tablet (4 mg total) by mouth every 8 (eight) hours as needed for up to 10 doses for nausea or vomiting. 04/07/21  Yes Smith, Zachary P, MD  oxyCODONE (ROXICODONE) 5 MG immediate release tablet Take 1 tablet (5 mg total) by mouth every 6 (six) hours as needed for severe pain. 04/09/21 04/09/22 Yes Donna Snooks, MD   oxyCODONE-acetaminophen (PERCOCET) 5-325 MG tablet Take 1 tablet by mouth every 8 (eight) hours as needed for up to 3 days for severe pain. 04/07/21 04/10/21 Yes Smith, Zachary P, MD    Allergies: Not on File  Social History:  reports that she has been smoking. She has never used smokeless tobacco. She reports that she does not drink alcohol and does not use drugs.   Family History: Family History  Family history unknown: Yes    Review of Systems: Review of Systems  Constitutional:  Negative for chills and fever.  Respiratory:  Negative for shortness of breath.   Cardiovascular:  Negative for chest pain.  Gastrointestinal:  Positive for abdominal pain, diarrhea and nausea. Negative for constipation and vomiting.  Genitourinary:  Negative for dysuria.  Musculoskeletal:  Negative for myalgias.  Skin:  Negative for rash.  Neurological:  Negative for dizziness.  Psychiatric/Behavioral:  Negative for depression.    Physical Exam BP (!) 146/89   Pulse 97   Temp 98.8 F (37.1 C) (Oral)   Ht 5' 5" (1.651 m)   Wt 188 lb 3.2 oz (85.4 kg)   SpO2 94%   BMI 31.32 kg/m  CONSTITUTIONAL: No acute distress. HEENT:  Normocephalic, atraumatic, extraocular motion intact. NECK: Trachea is midline, and there is no jugular venous distension.  RESPIRATORY:  Lungs are clear, and breath sounds are equal bilaterally. Normal respiratory effort without pathologic use of   accessory muscles. CARDIOVASCULAR: Heart is regular without murmurs, gallops, or rubs. GI: The abdomen is soft, non-distended, with tenderness to palpation in the RUQ and epigastric areas, no peritonitis, and negative Murphy's sign. MUSCULOSKELETAL:  Normal muscle strength and tone in all four extremities.  No peripheral edema or cyanosis. SKIN: Skin turgor is normal. There are no pathologic skin lesions.  NEUROLOGIC:  Motor and sensation is grossly normal.  Cranial nerves are grossly intact. PSYCH:  Alert and oriented to person, place  and time. Affect is normal.  Laboratory Analysis: Labs from 04/07/21: Na 134, K 4.1, Cl 98, CO2 24, BUN 11, Cr 0.65.  Total bili 0.7, AST 18, ALT 23, Alk Phos 92, lipase 77.  WBC 11.4, Hgb 15.7, Hct 45.2, Plt 419.  Imaging: U/S RUQ 04/07/21: IMPRESSION: Cholelithiasis without cholecystitis.  Hepatic steatosis.  Assessment and Plan: This is a 55 y.o. female with symptomatic cholelithiasis.  --Discussed with the patient the findings in her labwork at [facility] and in the ER, as well as the results of her ultrasound.  Overall the gallbladder is not thickened and there is no pericholecystic fluid.  Her lipase and WBC were improving yesterday so I think it was reasonable to d/c from ER and follow up today in office to schedule.  She's not toxic. --Discussed with her the role for robotic cholecystectomy in her case, and that given her symptoms and her lab findings, I did not recommend conservative measures with low fat diet only.  I think she would benefit from surgery.  She's in agreement.  Discussed with her the surgery at length, including risks of bleeding, infection, injury to surrounding structures, that this is planned as an outpatient surgery but depending on intraop findings, may need to admit overnight, may need a drain, post-op recovery, activity restrictions, and she's willing to proceed.  We would also inject ICG preop to help [place] evaluate the biliary anatomy intraop. --Patient will be scheduled for 04/14/21.  Will also give the patient a short prescription for oxycodone to help her with any additional pain.  Recommended that she can also try Ibuprofen.  However, if her pain is worsening, persistent, gets emesis, or other concerns, should come back to ER sooner.  Face-to-face time spent with the patient and care providers was 60 minutes, with more than 50% of the time spent counseling, educating, and coordinating care of the patient.     Melvyn Neth, [facility]

## 2021-04-10 ENCOUNTER — Other Ambulatory Visit: Payer: Self-pay

## 2021-04-10 ENCOUNTER — Encounter
Admission: RE | Admit: 2021-04-10 | Discharge: 2021-04-10 | Disposition: A | Discharge status: Home / Self Care | Payer: Managed Care, Other (non HMO) | Source: Ambulatory Visit | Attending: Surgery | Admitting: Surgery

## 2021-04-10 HISTORY — DX: Essential (primary) hypertension: I10

## 2021-04-10 HISTORY — DX: Calculus of gallbladder without cholecystitis without obstruction: K80.20

## 2021-04-10 HISTORY — DX: Gastro-esophageal reflux disease without esophagitis: K21.9

## 2021-04-10 NOTE — Patient Instructions (Addendum)
Your procedure is scheduled on:04-14-21 Tuesday Report to the Registration Desk on the 1st floor of the [facility].Then proceed to the 2nd floor Surgery Desk in the Medical [facility] To find out your arrival time, please call [phone] between 1PM - 3PM on:04-13-21 Monday  REMEMBER: Instructions that are not followed completely may result in serious medical risk, up to and including death; or upon the discretion of your surgeon and anesthesiologist your surgery may need to be rescheduled.  Do not eat food after midnight the night before surgery.  No gum chewing, lozengers or hard candies.  You may however, drink Water up to 2 hours before you are scheduled to arrive for your surgery. Do not drink anything within 2 hours of your scheduled arrival time  Type 1 and Type 2 diabetics should only drink water.  TAKE THESE MEDICATIONS THE MORNING OF SURGERY WITH A SIP OF WATER: -You may take Percocet for pain if needed the morning of surgery  Stop Metformin 2 days prior to surgery. Last Dose 04-11-21 Saturday  Take 1/2 of usual insulin dose the night before surgery and none on the morning of surgery. Take half of your Semglee Insulin Monday night (20 units)  One week prior to surgery: Stop Anti-inflammatories (NSAIDS) such as Advil, Aleve, Ibuprofen, Motrin, Naproxen, Naprosyn and Aspirin based products such as Excedrin, Goodys Powder, BC Powder.You may however, continue to take Tylenol/Percocet if needed for pain up until the day of surgery.  Stop ANY OVER THE COUNTER supplements/vitamins NOW (04-10-21) until after surgery.  No Alcohol for 24 hours before or after surgery.  No Smoking including e-cigarettes for 24 hours prior to surgery.  No chewable tobacco products for at least 6 hours prior to surgery.  No nicotine patches on the day of surgery.  Do not use any "recreational" drugs for at least a week prior to your surgery.  Please be advised that the combination of cocaine and anesthesia  may have negative outcomes, up to and including death. If you test positive for cocaine, your surgery will be cancelled.  On the morning of surgery brush your teeth with toothpaste and water, you may rinse your mouth with mouthwash if you wish. Do not swallow any toothpaste or mouthwash.  Do not wear jewelry, make-up, hairpins, clips or nail polish.  Do not wear lotions, powders, or perfumes.   Do not shave body from the neck down 48 hours prior to surgery just in case you cut yourself which could leave a site for infection.  Also, freshly shaved skin may become irritated if using the CHG soap.  Contact lenses, hearing aids and dentures may not be worn into surgery.  Do not bring valuables to the hospital. [facility] is not responsible for any missing/lost belongings or valuables.   Use CHG Soap as directed on instruction sheet.  Notify your doctor if there is any change in your medical condition (cold, fever, infection).  Wear comfortable clothing (specific to your surgery type) to the hospital.  After surgery, you can help prevent lung complications by doing breathing exercises.  Take deep breaths and cough every 1-2 hours. Your doctor may order a device called an Incentive Spirometer to help you take deep breaths. When coughing or sneezing, hold a pillow firmly against your incision with both hands. This is called "splinting." Doing this helps protect your incision. It also decreases belly discomfort.  If you are being admitted to the hospital overnight, leave your suitcase in the car. After surgery it  may be brought to your room.  If you are being discharged the day of surgery, you will not be allowed to drive home. You will need a responsible adult (18 years or older) to drive you home and stay with you that night.   If you are taking public transportation, you will need to have a responsible adult (18 years or older) with you. Please confirm with your physician that it is  acceptable to use public transportation.   Please call the Pre-admissions Testing Dept. at [phone] if you have any questions about these instructions.  Surgery Visitation Policy:  Patients undergoing a surgery or procedure may have one family member or support person with them as long as that person is not COVID-19 positive or experiencing its symptoms.  That person may remain in the waiting area during the procedure.  Inpatient Visitation:    Visiting hours are 7 a.m. to 8 p.m. Inpatients will be allowed two visitors daily. The visitors may change each day during the patient's stay. No visitors under the age of 52. Any visitor under the age of 59 must be accompanied by an adult. The visitor must pass COVID-19 screenings, use hand sanitizer when entering and exiting the patient's room and wear a mask at all times, including in the patient's room. Patients must also wear a mask when staff or their visitor are in the room. Masking is required regardless of vaccination status.

## 2021-04-13 MED ORDER — SODIUM CHLORIDE 0.9 % IV SOLN
2.0000 g | INTRAVENOUS | Status: AC
  Administered 2021-04-14: 2 g via INTRAVENOUS

## 2021-04-14 ENCOUNTER — Other Ambulatory Visit: Payer: Self-pay

## 2021-04-14 ENCOUNTER — Encounter
Admission: RE | Disposition: A | Discharge status: Home / Self Care | Payer: Self-pay | Source: Home / Self Care | Attending: Surgery

## 2021-04-14 ENCOUNTER — Ambulatory Visit: Payer: Managed Care, Other (non HMO) | Admitting: Certified Registered"

## 2021-04-14 ENCOUNTER — Ambulatory Visit
Admission: RE | Admit: 2021-04-14 | Discharge: 2021-04-14 | Disposition: A | Discharge status: Home / Self Care | Payer: Managed Care, Other (non HMO) | Attending: Surgery | Admitting: Surgery

## 2021-04-14 ENCOUNTER — Encounter: Payer: Self-pay | Admitting: Surgery

## 2021-04-14 DIAGNOSIS — K8080 Other cholelithiasis without obstruction: Secondary | ICD-10-CM

## 2021-04-14 DIAGNOSIS — E119 Type 2 diabetes mellitus without complications: Secondary | ICD-10-CM | POA: Diagnosis not present

## 2021-04-14 DIAGNOSIS — F17200 Nicotine dependence, unspecified, uncomplicated: Secondary | ICD-10-CM | POA: Diagnosis not present

## 2021-04-14 DIAGNOSIS — K760 Fatty (change of) liver, not elsewhere classified: Secondary | ICD-10-CM | POA: Diagnosis not present

## 2021-04-14 DIAGNOSIS — K8020 Calculus of gallbladder without cholecystitis without obstruction: Secondary | ICD-10-CM

## 2021-04-14 DIAGNOSIS — Z7984 Long term (current) use of oral hypoglycemic drugs: Secondary | ICD-10-CM | POA: Diagnosis not present

## 2021-04-14 DIAGNOSIS — K8010 Calculus of gallbladder with chronic cholecystitis without obstruction: Secondary | ICD-10-CM | POA: Insufficient documentation

## 2021-04-14 LAB — GLUCOSE, CAPILLARY
Glucose-Capillary: 242 mg/dL — ABNORMAL HIGH (ref 70–99)
Glucose-Capillary: 296 mg/dL — ABNORMAL HIGH (ref 70–99)

## 2021-04-14 SURGERY — CHOLECYSTECTOMY, ROBOT-ASSISTED, LAPAROSCOPIC
Anesthesia: General

## 2021-04-14 MED ORDER — PHENYLEPHRINE HCL (PRESSORS) 10 MG/ML IV SOLN
INTRAVENOUS | Status: DC | PRN
  Administered 2021-04-14: 200 ug via INTRAVENOUS
  Administered 2021-04-14: 100 ug via INTRAVENOUS
  Administered 2021-04-14: 200 ug via INTRAVENOUS

## 2021-04-14 MED ORDER — PROPOFOL 10 MG/ML IV BOLUS
INTRAVENOUS | Status: AC
  Filled 2021-04-14: qty 20

## 2021-04-14 MED ORDER — PROPOFOL 10 MG/ML IV BOLUS
INTRAVENOUS | Status: DC | PRN
  Administered 2021-04-14: 200 mg via INTRAVENOUS

## 2021-04-14 MED ORDER — LIDOCAINE HCL (CARDIAC) PF 100 MG/5ML IV SOSY
PREFILLED_SYRINGE | INTRAVENOUS | Status: DC | PRN
  Administered 2021-04-14: 100 mg via INTRAVENOUS

## 2021-04-14 MED ORDER — BUPIVACAINE-EPINEPHRINE (PF) 0.25% -1:200000 IJ SOLN
INTRAMUSCULAR | Status: DC | PRN
  Administered 2021-04-14: 30 mL

## 2021-04-14 MED ORDER — GABAPENTIN 300 MG PO CAPS
ORAL_CAPSULE | ORAL | Status: AC
  Administered 2021-04-14: 300 mg via ORAL
  Filled 2021-04-14: qty 1

## 2021-04-14 MED ORDER — SUGAMMADEX SODIUM 200 MG/2ML IV SOLN
INTRAVENOUS | Status: DC | PRN
  Administered 2021-04-14: 200 mg via INTRAVENOUS

## 2021-04-14 MED ORDER — OXYCODONE HCL 5 MG PO TABS: 5.0000 mg | ORAL_TABLET | ORAL | 0 refills | Status: DC | PRN

## 2021-04-14 MED ORDER — KETOROLAC TROMETHAMINE 30 MG/ML IJ SOLN
INTRAMUSCULAR | Status: DC | PRN
  Administered 2021-04-14: 30 mg via INTRAVENOUS

## 2021-04-14 MED ORDER — OXYCODONE HCL 5 MG PO TABS
5.0000 mg | ORAL_TABLET | Freq: Once | ORAL | Status: AC
  Administered 2021-04-14: 5 mg via ORAL

## 2021-04-14 MED ORDER — 0.9 % SODIUM CHLORIDE (POUR BTL) OPTIME
TOPICAL | Status: DC | PRN
  Administered 2021-04-14: 100 mL

## 2021-04-14 MED ORDER — FENTANYL CITRATE (PF) 100 MCG/2ML IJ SOLN: 25.0000 ug | INTRAMUSCULAR | Status: DC | PRN

## 2021-04-14 MED ORDER — DEXAMETHASONE SODIUM PHOSPHATE 10 MG/ML IJ SOLN
INTRAMUSCULAR | Status: DC | PRN
Start: 2021-04-14 — End: 2021-04-14
  Administered 2021-04-14: 10 mg via INTRAVENOUS

## 2021-04-14 MED ORDER — FENTANYL CITRATE (PF) 250 MCG/5ML IJ SOLN
INTRAMUSCULAR | Status: AC
  Filled 2021-04-14: qty 5

## 2021-04-14 MED ORDER — PROPOFOL 500 MG/50ML IV EMUL
INTRAVENOUS | Status: AC
  Filled 2021-04-14: qty 50

## 2021-04-14 MED ORDER — ONDANSETRON HCL 4 MG/2ML IJ SOLN
INTRAMUSCULAR | Status: DC | PRN
  Administered 2021-04-14: 4 mg via INTRAVENOUS

## 2021-04-14 MED ORDER — CHLORHEXIDINE GLUCONATE 0.12 % MT SOLN
OROMUCOSAL | Status: AC
  Administered 2021-04-14: 15 mL via OROMUCOSAL
  Filled 2021-04-14: qty 15

## 2021-04-14 MED ORDER — MIDAZOLAM HCL 2 MG/2ML IJ SOLN
INTRAMUSCULAR | Status: AC
  Filled 2021-04-14: qty 2

## 2021-04-14 MED ORDER — OXYCODONE HCL 5 MG PO TABS
ORAL_TABLET | ORAL | Status: AC
  Filled 2021-04-14: qty 1

## 2021-04-14 MED ORDER — FENTANYL CITRATE (PF) 100 MCG/2ML IJ SOLN
INTRAMUSCULAR | Status: DC | PRN
  Administered 2021-04-14: 50 ug via INTRAVENOUS
  Administered 2021-04-14: 100 ug via INTRAVENOUS
  Administered 2021-04-14: 50 ug via INTRAVENOUS

## 2021-04-14 MED ORDER — SODIUM CHLORIDE 0.9 % IV SOLN: INTRAVENOUS | Status: DC

## 2021-04-14 MED ORDER — DEXAMETHASONE SODIUM PHOSPHATE 10 MG/ML IJ SOLN
INTRAMUSCULAR | Status: AC
  Filled 2021-04-14: qty 1

## 2021-04-14 MED ORDER — FAMOTIDINE 20 MG PO TABS
ORAL_TABLET | ORAL | Status: AC
  Administered 2021-04-14: 20 mg via ORAL
  Filled 2021-04-14: qty 1

## 2021-04-14 MED ORDER — INDOCYANINE GREEN 25 MG IV SOLR
2.5000 mg | INTRAVENOUS | Status: AC
Start: 2021-04-14 — End: 2021-04-14
  Administered 2021-04-14: 2.5 mg via INTRAVENOUS
  Filled 2021-04-14: qty 1

## 2021-04-14 MED ORDER — KETOROLAC TROMETHAMINE 30 MG/ML IJ SOLN
INTRAMUSCULAR | Status: AC
  Filled 2021-04-14: qty 1

## 2021-04-14 MED ORDER — ONDANSETRON HCL 4 MG/2ML IJ SOLN: 4.0000 mg | Freq: Once | INTRAMUSCULAR | Status: DC | PRN

## 2021-04-14 MED ORDER — ROCURONIUM BROMIDE 100 MG/10ML IV SOLN
INTRAVENOUS | Status: DC | PRN
  Administered 2021-04-14: 50 mg via INTRAVENOUS
  Administered 2021-04-14: 30 mg via INTRAVENOUS

## 2021-04-14 MED ORDER — CHLORHEXIDINE GLUCONATE CLOTH 2 % EX PADS
6.0000 | MEDICATED_PAD | Freq: Once | CUTANEOUS | Status: AC
Start: 2021-04-14 — End: 2021-04-14
  Administered 2021-04-14: 6 via TOPICAL

## 2021-04-14 MED ORDER — BUPIVACAINE-EPINEPHRINE (PF) 0.25% -1:200000 IJ SOLN
INTRAMUSCULAR | Status: AC
  Filled 2021-04-14: qty 30

## 2021-04-14 MED ORDER — ONDANSETRON HCL 4 MG/2ML IJ SOLN
INTRAMUSCULAR | Status: AC
  Filled 2021-04-14: qty 2

## 2021-04-14 MED ORDER — GABAPENTIN 300 MG PO CAPS: 300.0000 mg | ORAL_CAPSULE | ORAL | Status: AC

## 2021-04-14 MED ORDER — LIDOCAINE HCL (PF) 2 % IJ SOLN
INTRAMUSCULAR | Status: AC
  Filled 2021-04-14: qty 5

## 2021-04-14 MED ORDER — EPHEDRINE 5 MG/ML INJ
INTRAVENOUS | Status: AC
  Filled 2021-04-14: qty 5

## 2021-04-14 MED ORDER — IBUPROFEN 600 MG PO TABS: 600.0000 mg | ORAL_TABLET | Freq: Three times a day (TID) | ORAL | 1 refills | Status: DC | PRN

## 2021-04-14 MED ORDER — SODIUM CHLORIDE 0.9 % IV SOLN
INTRAVENOUS | Status: AC
  Filled 2021-04-14: qty 2

## 2021-04-14 MED ORDER — ACETAMINOPHEN 500 MG PO TABS
ORAL_TABLET | ORAL | Status: AC
  Administered 2021-04-14: 1000 mg via ORAL
  Filled 2021-04-14: qty 2

## 2021-04-14 MED ORDER — ACETAMINOPHEN 500 MG PO TABS: 1000.0000 mg | ORAL_TABLET | ORAL | Status: AC

## 2021-04-14 MED ORDER — ORAL CARE MOUTH RINSE: 15.0000 mL | Freq: Once | OROMUCOSAL | Status: AC

## 2021-04-14 MED ORDER — MIDAZOLAM HCL 2 MG/2ML IJ SOLN
INTRAMUSCULAR | Status: DC | PRN
  Administered 2021-04-14: 2 mg via INTRAVENOUS

## 2021-04-14 MED ORDER — FAMOTIDINE 20 MG PO TABS: 20.0000 mg | ORAL_TABLET | Freq: Once | ORAL | Status: AC

## 2021-04-14 MED ORDER — EPHEDRINE SULFATE 50 MG/ML IJ SOLN
INTRAMUSCULAR | Status: DC | PRN
  Administered 2021-04-14 (×2): 10 mg via INTRAVENOUS

## 2021-04-14 MED ORDER — CHLORHEXIDINE GLUCONATE CLOTH 2 % EX PADS
6.0000 | MEDICATED_PAD | Freq: Once | CUTANEOUS | Status: AC
  Administered 2021-04-14: 6 via TOPICAL

## 2021-04-14 MED ORDER — CHLORHEXIDINE GLUCONATE 0.12 % MT SOLN: 15.0000 mL | Freq: Once | OROMUCOSAL | Status: AC

## 2021-04-14 MED ORDER — MEPERIDINE HCL 25 MG/ML IJ SOLN: 6.2500 mg | INTRAMUSCULAR | Status: DC | PRN

## 2021-04-14 SURGICAL SUPPLY — 59 items
ADH SKN CLS APL DERMABOND .7 (GAUZE/BANDAGES/DRESSINGS) ×1
APL PRP STRL LF DISP 70% ISPRP (MISCELLANEOUS) ×1
BAG INFUSER PRESSURE 100CC (MISCELLANEOUS) IMPLANT
BAG SPEC RTRVL LRG 6X4 10 (ENDOMECHANICALS) ×1
CANNULA REDUC XI 12-8 STAPL (CANNULA) ×1
CANNULA REDUCER 12-8 DVNC XI (CANNULA) ×1 IMPLANT
CHLORAPREP W/TINT 26 (MISCELLANEOUS) ×2 IMPLANT
CLIP VESOLOCK MED LG 6/CT (CLIP) ×2 IMPLANT
CUP MEDICINE 2OZ PLAST GRAD ST (MISCELLANEOUS) IMPLANT
DECANTER SPIKE VIAL GLASS SM (MISCELLANEOUS) ×2 IMPLANT
DEFOGGER SCOPE WARMER CLEARIFY (MISCELLANEOUS) ×2 IMPLANT
DERMABOND ADVANCED (GAUZE/BANDAGES/DRESSINGS) ×1
DERMABOND ADVANCED .7 DNX12 (GAUZE/BANDAGES/DRESSINGS) ×1 IMPLANT
DRAPE ARM DVNC X/XI (DISPOSABLE) ×4 IMPLANT
DRAPE COLUMN DVNC XI (DISPOSABLE) ×1 IMPLANT
DRAPE DA VINCI XI ARM (DISPOSABLE) ×4
DRAPE DA VINCI XI COLUMN (DISPOSABLE) ×1
ELECT CAUTERY BLADE TIP 2.5 (TIP) ×2
ELECT REM PT RETURN 9FT ADLT (ELECTROSURGICAL) ×2
ELECTRODE CAUTERY BLDE TIP 2.5 (TIP) ×1 IMPLANT
ELECTRODE REM PT RTRN 9FT ADLT (ELECTROSURGICAL) ×1 IMPLANT
GAUZE 4X4 16PLY ~~LOC~~+RFID DBL (SPONGE) ×2 IMPLANT
GLOVE SURG SYN 7.0 (GLOVE) ×10 IMPLANT
GLOVE SURG SYN 7.5  E (GLOVE) ×5
GLOVE SURG SYN 7.5 E (GLOVE) ×5 IMPLANT
GOWN STRL REUS W/ TWL LRG LVL3 (GOWN DISPOSABLE) ×4 IMPLANT
GOWN STRL REUS W/TWL LRG LVL3 (GOWN DISPOSABLE) ×8
IRRIGATION STRYKERFLOW (MISCELLANEOUS) ×1 IMPLANT
IRRIGATOR STRYKERFLOW (MISCELLANEOUS) ×2
IRRIGATOR SUCT 8 DISP DVNC XI (IRRIGATION / IRRIGATOR) IMPLANT
IRRIGATOR SUCTION 8MM XI DISP (IRRIGATION / IRRIGATOR)
IV NS 1000ML (IV SOLUTION)
IV NS 1000ML BAXH (IV SOLUTION) IMPLANT
KIT PINK PAD W/HEAD ARE REST (MISCELLANEOUS) ×2
KIT PINK PAD W/HEAD ARM REST (MISCELLANEOUS) ×1 IMPLANT
LABEL OR SOLS (LABEL) ×2 IMPLANT
MANIFOLD NEPTUNE II (INSTRUMENTS) ×2 IMPLANT
NEEDLE HYPO 22GX1.5 SAFETY (NEEDLE) ×2 IMPLANT
NS IRRIG 500ML POUR BTL (IV SOLUTION) ×2 IMPLANT
OBTURATOR OPTICAL STANDARD 8MM (TROCAR) ×1
OBTURATOR OPTICAL STND 8 DVNC (TROCAR) ×1
OBTURATOR OPTICALSTD 8 DVNC (TROCAR) ×1 IMPLANT
PACK LAP CHOLECYSTECTOMY (MISCELLANEOUS) ×2 IMPLANT
PENCIL ELECTRO HAND CTR (MISCELLANEOUS) ×2 IMPLANT
POUCH SPECIMEN RETRIEVAL 10MM (ENDOMECHANICALS) ×2 IMPLANT
SEAL CANN UNIV 5-8 DVNC XI (MISCELLANEOUS) ×3 IMPLANT
SEAL XI 5MM-8MM UNIVERSAL (MISCELLANEOUS) ×3
SET TUBE SMOKE EVAC HIGH FLOW (TUBING) ×2 IMPLANT
SOLUTION ELECTROLUBE (MISCELLANEOUS) ×2 IMPLANT
SPONGE T-LAP 18X18 ~~LOC~~+RFID (SPONGE) IMPLANT
SPONGE T-LAP 4X18 ~~LOC~~+RFID (SPONGE) IMPLANT
STAPLER CANNULA SEAL DVNC XI (STAPLE) ×1 IMPLANT
STAPLER CANNULA SEAL XI (STAPLE) ×1
SUT MNCRL AB 4-0 PS2 18 (SUTURE) ×2 IMPLANT
SUT VIC AB 3-0 SH 27 (SUTURE)
SUT VIC AB 3-0 SH 27X BRD (SUTURE) IMPLANT
SUT VICRYL 0 AB UR-6 (SUTURE) ×6 IMPLANT
TAPE TRANSPORE STRL 2 31045 (GAUZE/BANDAGES/DRESSINGS) ×2 IMPLANT
TROCAR BALLN GELPORT 12X130M (ENDOMECHANICALS) ×2 IMPLANT

## 2021-04-14 NOTE — Anesthesia Preprocedure Evaluation (Signed)
Anesthesia Evaluation  Patient identified by MRN, date of birth, ID band Patient awake    Reviewed: Allergy & Precautions, NPO status , Patient's Chart, lab work & pertinent test results  Airway Mallampati: II  TM Distance: >3 FB Neck ROM: Full    Dental  (+) Upper Dentures, Lower Dentures   Pulmonary neg pulmonary ROS, Current Smoker and Patient abstained from smoking.,    Pulmonary exam normal        Cardiovascular hypertension, negative cardio ROS Normal cardiovascular exam     Neuro/Psych negative neurological ROS  negative psych ROS   GI/Hepatic Neg liver ROS, GERD  ,  Endo/Other  negative endocrine ROSdiabetes, Poorly Controlled, Insulin Dependent  Renal/GU negative Renal ROS  negative genitourinary   Musculoskeletal negative musculoskeletal ROS (+)   Abdominal (+) + obese,   Peds negative pediatric ROS (+)  Hematology negative hematology ROS (+)   Anesthesia Other Findings Cholelithiases    Diabetes (HCC)    GERD (gastroesophageal reflux disease)  due to gallbladder-no meds  Hypertension  off meds x 2 years-better control now     Reproductive/Obstetrics negative OB ROS                             Anesthesia Physical Anesthesia Plan  ASA: 3  Anesthesia Plan: General   Post-op Pain Management:    Induction: Intravenous  PONV Risk Score and Plan: 3 and Propofol infusion, Ondansetron and Midazolam  Airway Management Planned: Oral ETT  Additional Equipment:   Intra-op Plan:   Post-operative Plan: Extubation in OR  Informed Consent: I have reviewed the patients History and Physical, chart, labs and discussed the procedure including the risks, benefits and alternatives for the proposed anesthesia with the patient or authorized representative who has indicated his/her understanding and acceptance.       Plan Discussed with: CRNA, Anesthesiologist and  Surgeon  Anesthesia Plan Comments:         Anesthesia Quick Evaluation

## 2021-04-14 NOTE — Anesthesia Procedure Notes (Signed)
Procedure Name: Intubation Date/Time: 04/14/2021 2:06 PM Performed by: Esaw Grandchild, CRNA Pre-anesthesia Checklist: Patient identified, Emergency Drugs available, Suction available and Patient being monitored Patient Re-evaluated:Patient Re-evaluated prior to induction Oxygen Delivery Method: Circle system utilized Preoxygenation: Pre-oxygenation with 100% oxygen Induction Type: IV induction Ventilation: Mask ventilation without difficulty and Oral airway inserted - appropriate to patient size Laryngoscope Size: Sabra Heck and 2 Grade View: Grade I Tube type: Oral Tube size: 7.0 mm Number of attempts: 1 Airway Equipment and Method: Stylet, Oral airway, LTA kit utilized and Bite block Placement Confirmation: ETT inserted through vocal cords under direct vision, positive ETCO2 and breath sounds checked- equal and bilateral Secured at: 22 cm Tube secured with: Tape Dental Injury: Teeth and Oropharynx as per pre-operative assessment

## 2021-04-14 NOTE — Anesthesia Postprocedure Evaluation (Signed)
Anesthesia Post Note  Patient: MILLEY VINING  Procedure(s) Performed: XI ROBOTIC ASSISTED LAPAROSCOPIC CHOLECYSTECTOMY INDOCYANINE GREEN FLUORESCENCE IMAGING (ICG)  Patient location during evaluation: PACU Anesthesia Type: General Level of consciousness: awake and alert Pain management: pain level controlled Vital Signs Assessment: post-procedure vital signs reviewed and stable Respiratory status: spontaneous breathing, nonlabored ventilation, respiratory function stable and patient connected to nasal cannula oxygen Cardiovascular status: blood pressure returned to baseline and stable Postop Assessment: no apparent nausea or vomiting Anesthetic complications: no   No notable events documented.   Last Vitals:  Vitals:   04/14/21 1645 04/14/21 1658  BP: (!) 123/53 (!) 136/52  Pulse: 92 83  Resp: 11 16  Temp:  (!) 36.3 C  SpO2: 90% 95%    Last Pain:  Vitals:   04/14/21 1658  TempSrc: Temporal  PainSc: 2                  Cleda Mccreedy Ty Buntrock

## 2021-04-14 NOTE — Interval H&P Note (Signed)
History and Physical Interval Note:  04/14/2021 1:22 PM  Lauren Whitney  has presented today for surgery, with the diagnosis of symptomatic cholelithiasis.  The various methods of treatment have been discussed with the patient and family. After consideration of risks, benefits and other options for treatment, the patient has consented to  Procedure(s): XI ROBOTIC ASSISTED LAPAROSCOPIC CHOLECYSTECTOMY (N/A) INDOCYANINE GREEN FLUORESCENCE IMAGING (ICG) (N/A) as a surgical intervention.  The patient's history has been reviewed, patient examined, no change in status, stable for surgery.  I have reviewed the patient's chart and labs.  Questions were answered to the patient's satisfaction.     Mikaela Hilgeman

## 2021-04-14 NOTE — Op Note (Signed)
  Procedure Date:  04/14/2021  Pre-operative Diagnosis:  Symptomatic cholelithiasis  Post-operative Diagnosis:  Symptomatic cholelithiasis  Procedure:  Robotic assisted cholecystectomy with ICG FireFly cholangiogram  Surgeon:  Howie Ill, MD  Assistant:  Yetta Glassman, PA-S  Anesthesia:  General endotracheal  Estimated Blood Loss:  10 ml  Specimens:  gallbladder  Complications:  None  Indications for Procedure:  This is a 55 y.o. female who presents with abdominal pain and workup revealing symptomatic cholelithiasis.  The benefits, complications, treatment options, and expected outcomes were discussed with the patient. The risks of bleeding, infection, recurrence of symptoms, failure to resolve symptoms, bile duct damage, bile duct leak, retained common bile duct stone, bowel injury, and need for further procedures were all discussed with the patient and she was willing to proceed.  Description of Procedure: The patient was correctly identified in the preoperative area and brought into the operating room.  The patient was placed supine with VTE prophylaxis in place.  Appropriate time-outs were performed.  Anesthesia was induced and the patient was intubated.  Appropriate antibiotics were infused.  The abdomen was prepped and draped in a sterile fashion. An infraumbilical incision was made. A cutdown technique was used to enter the abdominal cavity without injury, and a 12 mm robotic port was inserted.  Pneumoperitoneum was obtained with appropriate opening pressures.  Three 8-mm ports were placed in the mid abdomen at the level of the umbilicus under direct visualization.  The DaVinci platform was docked, camera targeted, and instruments were placed under direct visualization.  The gallbladder was identified.  The fundus was grasped and retracted cephalad.  Adhesions were lysed bluntly and with electrocautery. The infundibulum was grasped and retracted laterally, exposing the  peritoneum overlying the gallbladder.  This was incised with electrocautery and extended on either side of the gallbladder.  FireFly cholangiogram was then obtained, and we were able to clearly identify the cystic duct and common bile duct.  The cystic duct and cystic artery were carefully dissected with combination of cautery and blunt dissection.  The gallbladder was folded somewhat onto itself towards the neck of the gallbladder.  Both cystic duct and artery were clipped twice proximally and once distally, cutting in between.  The gallbladder was taken from the gallbladder fossa in a retrograde fashion with electrocautery. The gallbladder was placed in an Endocatch bag. The liver bed was inspected and any bleeding was controlled with electrocautery. The right upper quadrant was then inspected again revealing intact clips, no bleeding, and no ductal injury.   The 8 mm ports were removed under direct visualization and the 12 mm port was removed.  The Endocatch bag was brought out via the umbilical incision. The fascial opening was closed using 0 vicryl sutures.  Local anesthetic was infused in all incisions and the incisions were closed with 4-0 Monocryl.  The wounds were cleaned and sealed with DermaBond.  The patient was emerged from anesthesia and extubated and brought to the recovery room for further management.  The patient tolerated the procedure well and all counts were correct at the end of the case.   Howie Ill, MD

## 2021-04-14 NOTE — Discharge Instructions (Signed)
AMBULATORY SURGERY  ?DISCHARGE INSTRUCTIONS ? ? ?The drugs that you were given will stay in your system until tomorrow so for the next 24 hours you should not: ? ?Drive an automobile ?Make any legal decisions ?Drink any alcoholic beverage ? ? ?You may resume regular meals tomorrow.  Today it is better to start with liquids and gradually work up to solid foods. ? ?You may eat anything you prefer, but it is better to start with liquids, then soup and crackers, and gradually work up to solid foods. ? ? ?Please notify your doctor immediately if you have any unusual bleeding, trouble breathing, redness and pain at the surgery site, drainage, fever, or pain not relieved by medication. ? ? ? ?Additional Instructions: ? ? ? ?Please contact your physician with any problems or Same Day Surgery at [phone], Monday through Friday 6 am to 4 pm, or [place] at [place] Main number at [phone].  ?

## 2021-04-14 NOTE — Transfer of Care (Signed)
Immediate Anesthesia Transfer of Care Note  Patient: Lauren Whitney  Procedure(s) Performed: XI ROBOTIC ASSISTED LAPAROSCOPIC CHOLECYSTECTOMY INDOCYANINE Earnestine Tuohey FLUORESCENCE IMAGING (ICG)  Patient Location: PACU  Anesthesia Type:General  Level of Consciousness: drowsy  Airway & Oxygen Therapy: Patient Spontanous Breathing and Patient connected to face mask oxygen  Post-op Assessment: Report given to RN and Post -op Vital signs reviewed and stable  Post vital signs: Reviewed and stable  Last Vitals:  Vitals Value Taken Time  BP    Temp    Pulse    Resp    SpO2      Last Pain:  Vitals:   04/14/21 1105  TempSrc: Temporal  PainSc: 0-No pain         Complications: No notable events documented.

## 2021-04-16 LAB — SURGICAL PATHOLOGY

## 2021-04-29 ENCOUNTER — Ambulatory Visit (INDEPENDENT_AMBULATORY_CARE_PROVIDER_SITE_OTHER): Payer: Managed Care, Other (non HMO) | Admitting: Surgery

## 2021-04-29 ENCOUNTER — Encounter: Payer: Self-pay | Admitting: Surgery

## 2021-04-29 ENCOUNTER — Other Ambulatory Visit: Payer: Self-pay

## 2021-04-29 VITALS — BP 135/83 | HR 96 | Temp 99.1°F | Ht 65.0 in | Wt 187.0 lb

## 2021-04-29 DIAGNOSIS — K8020 Calculus of gallbladder without cholecystitis without obstruction: Secondary | ICD-10-CM

## 2021-04-29 DIAGNOSIS — Z09 Encounter for follow-up examination after completed treatment for conditions other than malignant neoplasm: Secondary | ICD-10-CM

## 2021-04-29 NOTE — Progress Notes (Signed)
04/29/2021  HPI: Lauren Whitney is a 55 y.o. female s/p robotic assisted cholecystectomy on 04/14/2021.  Patient presents today for follow-up.  Patient reports that she has been doing very well with minimal pain at the incisions.  Denies any nausea, vomiting.  She is eating well.  The only issue she reports is some mild diarrhea.  Vital signs: BP 135/83   Pulse 96   Temp 99.1 F (37.3 C) (Oral)   Ht 5\' 5"  (1.651 m)   Wt 187 lb (84.8 kg)   SpO2 96%   BMI 31.12 kg/m    Physical Exam: Constitutional: No acute distress Abdomen: Soft, nondistended, nontender to palpation.  Incisions are healing well and are clean, dry, intact with some Dermabond still in place although starting to peel off.  Assessment/Plan: This is a 55 y.o. female s/p robotic assisted cholecystectomy.  - Discussed with the patient that the diarrhea as part of the adjustment process that her body is undergoing now that she no longer has a gallbladder.  This should be improving with time.  She could potentially start adding Benefiber or Metamucil to help with the loose stools. - Reviewed with her activity restrictions of no heavy lifting or pushing of no more than 10 to 15 pounds for total of 4 weeks after surgery. - Follow-up as needed  53, MD  [facility]

## 2021-04-29 NOTE — Patient Instructions (Addendum)
If you have any concerns or questions, please feel free to call our office.   GENERAL POST-OPERATIVE PATIENT INSTRUCTIONS   WOUND CARE INSTRUCTIONS:  Keep a dry clean dressing on the wound if there is drainage. The initial bandage may be removed after 24 hours.  Once the wound has quit draining you may leave it open to air.  If clothing rubs against the wound or causes irritation and the wound is not draining you may cover it with a dry dressing during the daytime.  Try to keep the wound dry and avoid ointments on the wound unless directed to do so.  If the wound becomes bright red and painful or starts to drain infected material that is not clear, please contact your physician immediately.  If the wound is mildly pink and has a thick firm ridge underneath it, this is normal, and is referred to as a healing ridge.  This will resolve over the next 4-6 weeks.  BATHING: You may shower if you have been informed of this by your surgeon. However, Please do not submerge in a tub, hot tub, or pool until incisions are completely sealed or have been told by your surgeon that you may do so.  DIET:  You may eat any foods that you can tolerate.  It is a good idea to eat a high fiber diet and take in plenty of fluids to prevent constipation.  If you do become constipated you may want to take a mild laxative or take ducolax tablets on a daily basis until your bowel habits are regular.  Constipation can be very uncomfortable, along with straining, after recent surgery.  ACTIVITY:  You are encouraged to cough and deep breath or use your incentive spirometer if you were given one, every 15-30 minutes when awake.  This will help prevent respiratory complications and low grade fevers post-operatively if you had a general anesthetic.  You may want to hug a pillow when coughing and sneezing to add additional support to the surgical area, if you had abdominal or chest surgery, which will decrease pain during these times.  You  are encouraged to walk and engage in light activity for the next two weeks.  You should not lift more than 20 pounds, until 05/12/2021 as it could put you at increased risk for complications.  Twenty pounds is roughly equivalent to a plastic bag of groceries. At that time- Listen to your body when lifting, if you have pain when lifting, stop and then try again in a few days. Soreness after doing exercises or activities of daily living is normal as you get back in to your normal routine.  MEDICATIONS:  Try to take narcotic medications and anti-inflammatory medications, such as tylenol, ibuprofen, naprosyn, etc., with food.  This will minimize stomach upset from the medication.  Should you develop nausea and vomiting from the pain medication, or develop a rash, please discontinue the medication and contact your physician.  You should not drive, make important decisions, or operate machinery when taking narcotic pain medication.  SUNBLOCK Use sun block to incision area over the next year if this area will be exposed to sun. This helps decrease scarring and will allow you avoid a permanent darkened area over your incision.   Gallbladder Eating Plan  If you have a gallbladder condition, you may have trouble digesting fats. Eating a low-fat diet can help reduce your symptoms, and may be helpful before and after having surgery to remove your gallbladder (cholecystectomy). Your health  care provider may recommend that you work with a diet and nutrition specialist (dietitian) to help you reduce the amount of fat in your diet. What are tips for following this plan? General guidelines Limit your fat intake to less than 30% of your total daily calories. If you eat around 1,800 calories each day, this is less than 60 grams (g) of fat per day. Fat is an important part of a healthy diet. Eating a low-fat diet can make it hard to maintain a healthy body weight. Ask your dietitian how much fat, calories, and other  nutrients you need each day. Eat small, frequent meals throughout the day instead of three large meals. Drink at least 8-10 cups of fluid a day. Drink enough fluid to keep your urine clear or pale yellow. Limit alcohol intake to no more than 1 drink a day for nonpregnant women and 2 drinks a day for men. One drink equals 12 oz of beer, 5 oz of wine, or 1 oz of hard liquor. Reading food labels  Check Nutrition Facts on food labels for the amount of fat per serving. Choose foods with less than 3 grams of fat per serving. Shopping Choose nonfat and low-fat healthy foods. Look for the words "nonfat," "low fat," or "fat free." Avoid buying processed or prepackaged foods. Cooking Cook using low-fat methods, such as baking, broiling, grilling, or boiling. Cook with small amounts of healthy fats, such as olive oil, grapeseed oil, canola oil, or sunflower oil. What foods are recommended? All fresh, frozen, or canned fruits and vegetables. Whole grains. Low-fat or non-fat (skim) milk and yogurt. Lean meat, skinless poultry, fish, eggs, and beans. Low-fat protein supplement powders or drinks. Spices and herbs. What foods are not recommended? High-fat foods. These include baked goods, fast food, fatty cuts of meat, ice cream, french toast, sweet rolls, pizza, cheese bread, foods covered with butter, creamy sauces, or cheese. Fried foods. These include french fries, tempura, battered fish, breaded chicken, fried breads, and sweets. Foods with strong odors. Foods that cause bloating and gas. Summary A low-fat diet can be helpful if you have a gallbladder condition, or before and after gallbladder surgery. Limit your fat intake to less than 30% of your total daily calories. This is about 60 g of fat if you eat 1,800 calories each day. Eat small, frequent meals throughout the day instead of three large meals. This information is not intended to replace advice given to you by your health care provider.  Make sure you discuss any questions you have with your health care provider. Document Revised: 04/03/2020 Document Reviewed: 04/03/2020 Elsevier Patient Education  2022 [employer].

## 2022-02-17 ENCOUNTER — Emergency Department: Payer: Managed Care, Other (non HMO)

## 2022-02-17 ENCOUNTER — Other Ambulatory Visit: Payer: Self-pay

## 2022-02-17 ENCOUNTER — Emergency Department
Admission: EM | Admit: 2022-02-17 | Discharge: 2022-02-17 | Disposition: A | Discharge status: Home / Self Care | Payer: Managed Care, Other (non HMO) | Attending: Emergency Medicine | Admitting: Emergency Medicine

## 2022-02-17 DIAGNOSIS — L03811 Cellulitis of head [any part, except face]: Secondary | ICD-10-CM | POA: Insufficient documentation

## 2022-02-17 DIAGNOSIS — E119 Type 2 diabetes mellitus without complications: Secondary | ICD-10-CM | POA: Diagnosis not present

## 2022-02-17 DIAGNOSIS — M542 Cervicalgia: Secondary | ICD-10-CM | POA: Diagnosis present

## 2022-02-17 DIAGNOSIS — I10 Essential (primary) hypertension: Secondary | ICD-10-CM | POA: Insufficient documentation

## 2022-02-17 DIAGNOSIS — D72829 Elevated white blood cell count, unspecified: Secondary | ICD-10-CM | POA: Diagnosis not present

## 2022-02-17 LAB — BASIC METABOLIC PANEL
Anion gap: 7 (ref 5–15)
BUN: 9 mg/dL (ref 6–20)
CO2: 26 mmol/L (ref 22–32)
Calcium: 9.5 mg/dL (ref 8.9–10.3)
Chloride: 107 mmol/L (ref 98–111)
Creatinine, Ser: 0.5 mg/dL (ref 0.44–1.00)
GFR, Estimated: >60 mL/min (ref >60)
Glucose, Bld: 276 mg/dL — ABNORMAL HIGH (ref 70–99)
Potassium: 4.3 mmol/L (ref 3.5–5.1)
Sodium: 140 mmol/L (ref 135–145)

## 2022-02-17 LAB — CBC WITH DIFFERENTIAL/PLATELET
Abs Immature Granulocytes: 0.04 10*3/uL (ref 0.00–0.07)
Basophils Absolute: 0.1 10*3/uL (ref 0.0–0.1)
Basophils Relative: 1 %
Eosinophils Absolute: 0.3 10*3/uL (ref 0.0–0.5)
Eosinophils Relative: 3 %
HCT: 48.6 % — ABNORMAL HIGH (ref 36.0–46.0)
Hemoglobin: 15.7 g/dL — ABNORMAL HIGH (ref 12.0–15.0)
Immature Granulocytes: 0 %
Lymphocytes Relative: 30 %
Lymphs Abs: 3.8 10*3/uL (ref 0.7–4.0)
MCH: 27.8 pg (ref 26.0–34.0)
MCHC: 32.3 g/dL (ref 30.0–36.0)
MCV: 86.2 fL (ref 80.0–100.0)
Monocytes Absolute: 0.6 10*3/uL (ref 0.1–1.0)
Monocytes Relative: 5 %
Neutro Abs: 7.8 10*3/uL — ABNORMAL HIGH (ref 1.7–7.7)
Neutrophils Relative %: 61 %
Platelets: 387 10*3/uL (ref 150–400)
RBC: 5.64 MIL/uL — ABNORMAL HIGH (ref 3.87–5.11)
RDW: 13.7 % (ref 11.5–15.5)
WBC: 12.6 10*3/uL — ABNORMAL HIGH (ref 4.0–10.5)
nRBC: 0 % (ref 0.0–0.2)

## 2022-02-17 MED ORDER — SODIUM CHLORIDE 0.9 % IV SOLN
1.0000 g | Freq: Once | INTRAVENOUS | Status: AC
  Administered 2022-02-17: 1 g via INTRAVENOUS
  Filled 2022-02-17: qty 10

## 2022-02-17 MED ORDER — OXYCODONE-ACETAMINOPHEN 7.5-325 MG PO TABS: 1.0000 | ORAL_TABLET | Freq: Four times a day (QID) | ORAL | 0 refills | Status: DC | PRN

## 2022-02-17 MED ORDER — OXYCODONE-ACETAMINOPHEN 7.5-325 MG PO TABS
1.0000 | ORAL_TABLET | Freq: Once | ORAL | Status: AC
  Administered 2022-02-17: 1 via ORAL
  Filled 2022-02-17: qty 1

## 2022-02-17 MED ORDER — IOHEXOL 300 MG/ML  SOLN
75.0000 mL | Freq: Once | INTRAMUSCULAR | Status: AC | PRN
  Administered 2022-02-17: 75 mL via INTRAVENOUS

## 2022-02-17 MED ORDER — CLINDAMYCIN HCL 300 MG PO CAPS: 300.0000 mg | ORAL_CAPSULE | Freq: Three times a day (TID) | ORAL | 0 refills | Status: AC

## 2022-02-17 NOTE — ED Triage Notes (Signed)
Pt here with a knot on the back of her head. Pt states she did not hit her head. Pt states she went to the [facility] last week and was given muscle relaxers but they have not helped the pain. Pt ambulatory to triage.

## 2022-02-17 NOTE — ED Provider Notes (Signed)
[facility] Provider Note    Event Date/Time   First MD Initiated Contact with Patient 02/17/22 1154     (approximate)   History   Abscess   HPI  Lauren Whitney is a 56 y.o. female presents to the ED with complaint of tenderness to the right side of her neck at the base of her scalp.  She was seen at an urgent care last week where she was given a muscle relaxant and ibuprofen which she states did not help.  Patient denies any fever, chills, nausea or vomiting.  She denies any injury to her scalp or head.  Patient does have a history of diabetes, hypertension and GERD.     Physical Exam   Triage Vital Signs: ED Triage Vitals [02/17/22 1150]  Enc Vitals Group     BP (!) 159/68     Pulse Rate 94     Resp 16     Temp 98.6 F (37 C)     Temp Source Oral     SpO2 99 %     Weight 185 lb (83.9 kg)     Height 5\' 5"  (1.651 m)     Head Circumference      Peak Flow      Pain Score 8     Pain Loc      Pain Edu?      Excl. in GC?     Most recent vital signs: Vitals:   02/17/22 1150 02/17/22 1445  BP: (!) 159/68 (!) 146/67  Pulse: 94 77  Resp: 16 16  Temp: 98.6 F (37 C)   SpO2: 99% 98%     General: Awake, no distress.  Alert, talkative. CV:  Good peripheral perfusion.  Resp:  Normal effort.  Abd:  No distention.  Other:  Scalp with erythema and marked tenderness to palpation posteriorly on the right lower portion with minimal extending into the cervical area.  No vesicles or lesions noted.  No drainage present.  Scalp is soft to palpation with questionable fluid collection.  No tenderness is noted on palpation of the vertebral bodies posteriorly.   ED Results / Procedures / Treatments   Labs (all labs ordered are listed, but only abnormal results are displayed) Labs Reviewed  CBC WITH DIFFERENTIAL/PLATELET - Abnormal; Notable for the following components:      Result Value   WBC 12.6 (*)    RBC 5.64 (*)    Hemoglobin 15.7 (*)    HCT  48.6 (*)    Neutro Abs 7.8 (*)    All other components within normal limits  BASIC METABOLIC PANEL - Abnormal; Notable for the following components:   Glucose, Bld 276 (*)    All other components within normal limits      RADIOLOGY CT scan per radiologist of soft tissue cervical shows skin infection but no localized abscess was noted.  Atherosclerotic plaque was also noted on the CT scan and visualized in the aortic arch and proximal major branch vessels of the neck and carotid arteries.  A nonemergent carotid artery duplex should be considered.    PROCEDURES:  Critical Care performed:   Procedures   MEDICATIONS ORDERED IN ED: Medications  cefTRIAXone (ROCEPHIN) 1 g in sodium chloride 0.9 % 100 mL IVPB (1 g Intravenous New Bag/Given 02/17/22 1447)  oxyCODONE-acetaminophen (PERCOCET) 7.5-325 MG per tablet 1 tablet (1 tablet Oral Given 02/17/22 1448)  iohexol (OMNIPAQUE) 300 MG/ML solution 75 mL (75 mLs Intravenous Contrast Given 02/17/22  1526)     IMPRESSION / MDM / ASSESSMENT AND PLAN / ED COURSE  I reviewed the triage vital signs and the nursing notes.   Differential diagnosis includes, but is not limited to, cellulitis scalp, abscess scalp, dermatitis etiology unknown.  56 year old female presents to the ED with complaint of right posterior scalp pain that seems to be increasing and also has some warmth to it when she touches it.  Patient states that she was seen in urgent care where she was prescribed a muscle relaxant and ibuprofen which has not helped.  Area appears to be a cellulitis with warmth and erythema.  White count was mildly elevated at 12.6 and glucose on BMP was elevated at 276 which was nonfasting.  Rocephin 1 g IV was given to the patient while in the ED waiting for CT results.  Patient was made aware that this is a cellulitis and no localized abscess was noted.  We also discussed the incidental finding of the plaque and the need for outpatient studies and  nonemergent carotid duplex.  Patient agrees that she will call her PCP to see if this can be done outpatient through his office or whether he would like to refer her elsewhere.  Prescription for clindamycin was sent to the pharmacy along with a prescription for Percocet as needed for pain.  She is encouraged to use warm moist compresses to the area frequently and to return to the emergency department if she develops any severe worsening of her symptoms such as fever, chills, nausea and vomiting.      Patient's presentation is most consistent with acute complicated illness / injury requiring diagnostic workup.  FINAL CLINICAL IMPRESSION(S) / ED DIAGNOSES   Final diagnoses:  Cellulitis of scalp     Rx / DC Orders   ED Discharge Orders          Ordered    clindamycin (CLEOCIN) 300 MG capsule  3 times daily        02/17/22 1613    oxyCODONE-acetaminophen (PERCOCET) 7.5-325 MG tablet  Every 6 hours PRN        02/17/22 1613             Note:  This document was prepared using Dragon voice recognition software and may include unintentional dictation errors.   Tommi Rumps, PA-C 02/17/22 1629    Chesley Noon, MD 02/17/22 [phone]

## 2022-02-17 NOTE — ED Notes (Signed)
Pt reports a knot on the right lower back of her head since last week. She was given IBU 800mg  and robaxin by the walk-in clinic. Pt states that the pain is still present despite the pain medication. Pt has also taken tylenol with no relief. Pt reports being unable to fully turn her head side to side.

## 2022-02-17 NOTE — ED Notes (Signed)
Patient given discharge instructions, all questions answered. Patient in possession of all belongings, directed to the discharge area  

## 2022-02-17 NOTE — ED Triage Notes (Signed)
Knot on back of head.  [facility] and was given muscle relaxers and ibuprofen, but it is getting worse.

## 2022-02-17 NOTE — Discharge Instructions (Addendum)
Follow-up with your primary care provider if any continued problems.  Return to the emergency department should you develop high fever, nausea, vomiting or worsening of your symptoms.  A prescription for antibiotics was sent to the pharmacy along with pain medication.  Be aware that the pain medication could cause drowsiness and increase your risk for injury.  You may also use warm moist compresses to the back of your scalp which may help with the infection.  CT scan did not show an abscess but skin infection only at this time.  Discontinue taking the muscle relaxant as this will not help.  On incidental finding on your CT scan showed plaque and the right carotid artery that should be followed up with a nonemergent study for further evaluation.

## 2022-02-18 ENCOUNTER — Encounter: Payer: Self-pay | Admitting: Emergency Medicine

## 2022-02-18 DIAGNOSIS — R519 Headache, unspecified: Secondary | ICD-10-CM | POA: Diagnosis present

## 2022-02-18 DIAGNOSIS — L03811 Cellulitis of head [any part, except face]: Secondary | ICD-10-CM | POA: Diagnosis not present

## 2022-02-18 NOTE — ED Triage Notes (Signed)
Pt presents via POV with complaints of a knot on the back of her head. She notes being seen here yesterday and was prescribed antibiotics (taken 2 doses) and pain meds which hasn't improved the knot and is causing a headache. She notes having a reaction to the pain meds last night with sharp burning pain to the site of the cellulitis. Denies SOB or CP.

## 2022-02-19 ENCOUNTER — Emergency Department
Admission: EM | Admit: 2022-02-19 | Discharge: 2022-02-19 | Disposition: A | Discharge status: Home / Self Care | Payer: Managed Care, Other (non HMO) | Attending: Emergency Medicine | Admitting: Emergency Medicine

## 2022-02-19 DIAGNOSIS — L03811 Cellulitis of head [any part, except face]: Secondary | ICD-10-CM

## 2022-02-19 MED ORDER — GABAPENTIN 300 MG PO CAPS: 300.0000 mg | ORAL_CAPSULE | Freq: Three times a day (TID) | ORAL | 0 refills | Status: DC | PRN

## 2022-02-19 MED ORDER — GABAPENTIN 300 MG PO CAPS
300.0000 mg | ORAL_CAPSULE | Freq: Once | ORAL | Status: AC
  Administered 2022-02-19: 300 mg via ORAL
  Filled 2022-02-19: qty 1

## 2022-03-31 ENCOUNTER — Other Ambulatory Visit: Payer: Self-pay | Admitting: Family Medicine

## 2022-03-31 ENCOUNTER — Other Ambulatory Visit (HOSPITAL_COMMUNITY): Payer: Self-pay | Admitting: Family Medicine

## 2022-03-31 DIAGNOSIS — I739 Peripheral vascular disease, unspecified: Secondary | ICD-10-CM

## 2022-04-07 ENCOUNTER — Ambulatory Visit
Admission: RE | Admit: 2022-04-07 | Discharge: 2022-04-07 | Disposition: A | Discharge status: Home / Self Care | Payer: Managed Care, Other (non HMO) | Source: Ambulatory Visit | Attending: Family Medicine | Admitting: Family Medicine

## 2022-04-07 DIAGNOSIS — I739 Peripheral vascular disease, unspecified: Secondary | ICD-10-CM | POA: Insufficient documentation

## 2022-04-07 DIAGNOSIS — I6522 Occlusion and stenosis of left carotid artery: Secondary | ICD-10-CM | POA: Insufficient documentation

## 2022-04-14 ENCOUNTER — Other Ambulatory Visit: Payer: Self-pay | Admitting: Family Medicine

## 2022-04-14 ENCOUNTER — Other Ambulatory Visit (HOSPITAL_COMMUNITY): Payer: Self-pay | Admitting: Family Medicine

## 2022-04-14 DIAGNOSIS — I6529 Occlusion and stenosis of unspecified carotid artery: Secondary | ICD-10-CM

## 2022-04-14 DIAGNOSIS — R9389 Abnormal findings on diagnostic imaging of other specified body structures: Secondary | ICD-10-CM

## 2022-04-23 ENCOUNTER — Ambulatory Visit
Admission: RE | Admit: 2022-04-23 | Discharge: 2022-04-23 | Disposition: A | Discharge status: Home / Self Care | Payer: Managed Care, Other (non HMO) | Source: Ambulatory Visit | Attending: Family Medicine | Admitting: Family Medicine

## 2022-04-23 DIAGNOSIS — R9389 Abnormal findings on diagnostic imaging of other specified body structures: Secondary | ICD-10-CM | POA: Diagnosis present

## 2022-04-23 DIAGNOSIS — I6529 Occlusion and stenosis of unspecified carotid artery: Secondary | ICD-10-CM | POA: Diagnosis present

## 2022-04-23 MED ORDER — IOHEXOL 350 MG/ML SOLN
75.0000 mL | Freq: Once | INTRAVENOUS | Status: AC | PRN
  Administered 2022-04-23: 75 mL via INTRAVENOUS

## 2022-04-24 IMAGING — US US ABDOMEN LIMITED
1 series · 15 of 25 positions shown · non-contrast
Comparison: None.

CLINICAL DATA: Acute right upper quadrant abdominal pain.

EXAM:
ULTRASOUND ABDOMEN LIMITED RIGHT UPPER QUADRANT

[Series 1: us abdomen limited ruq · 15 of 31 slices shown]
[im 1/31]
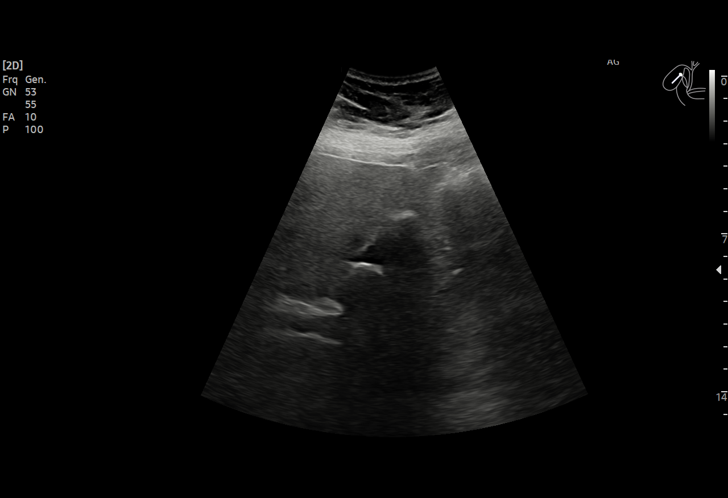
[im 3/31]
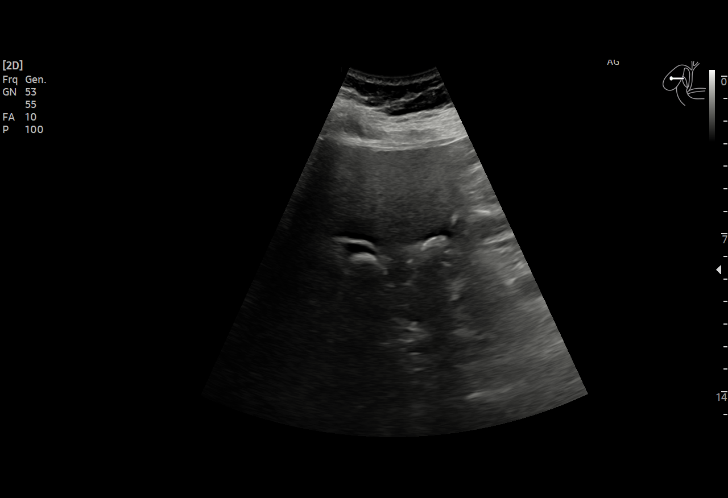
[im 6/31]
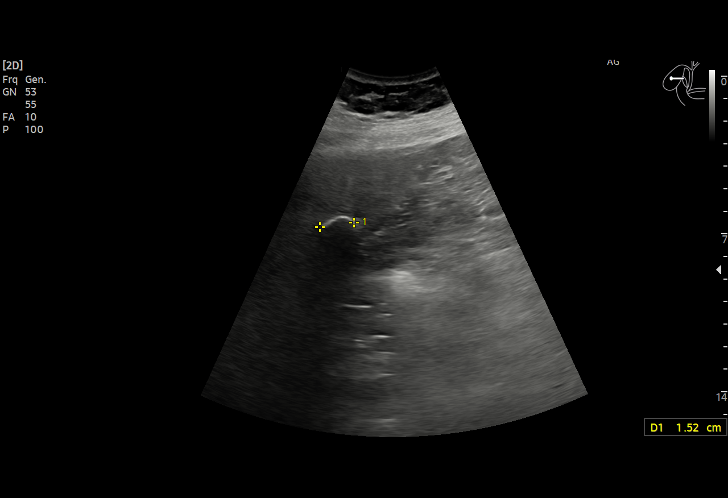
[im 7/31]
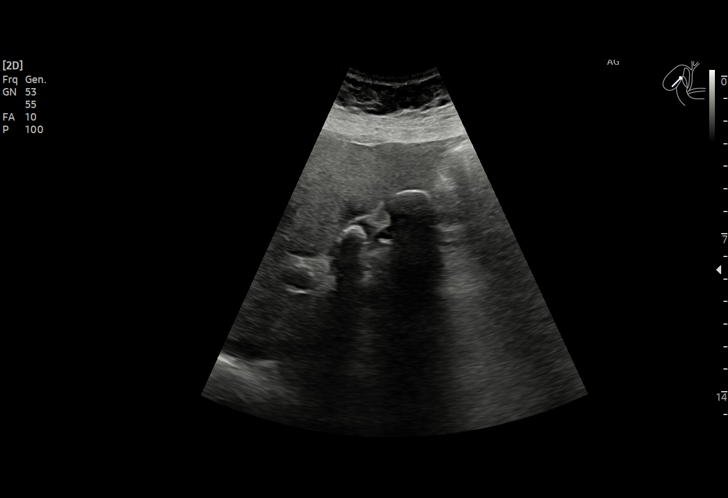
[im 9/31]
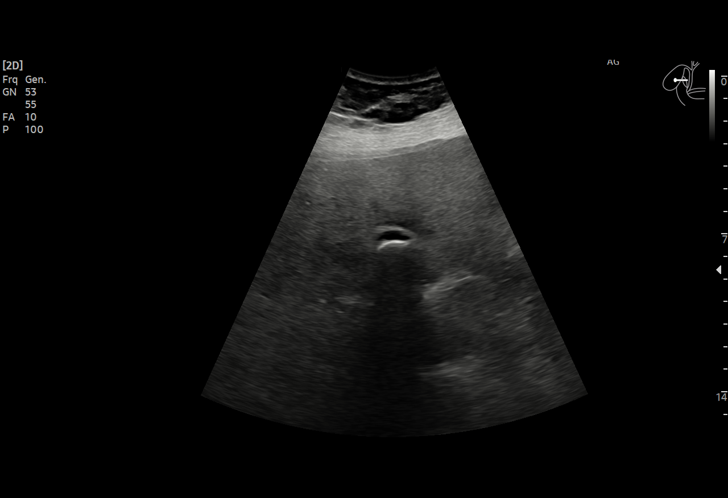
[im 12/31]
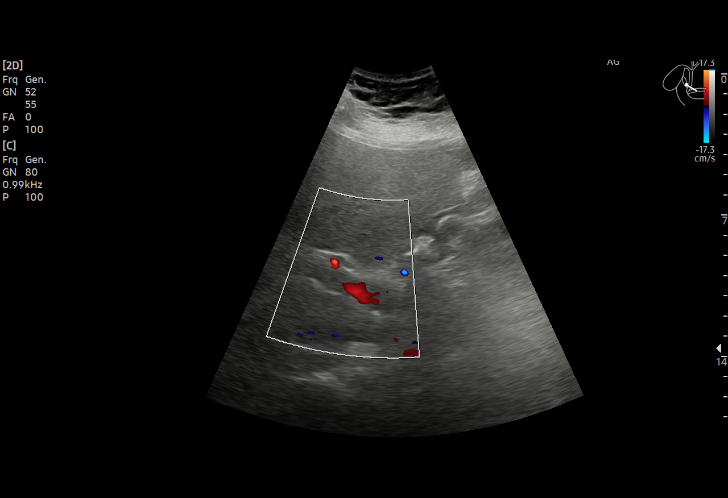
[im 13/31]
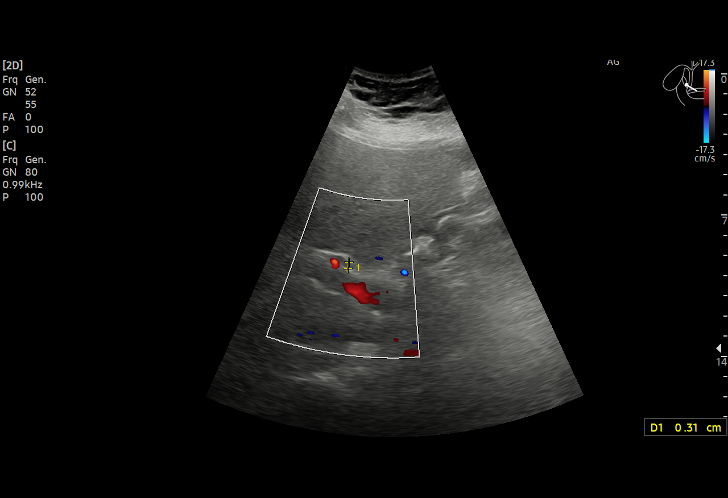
[im 16/31]
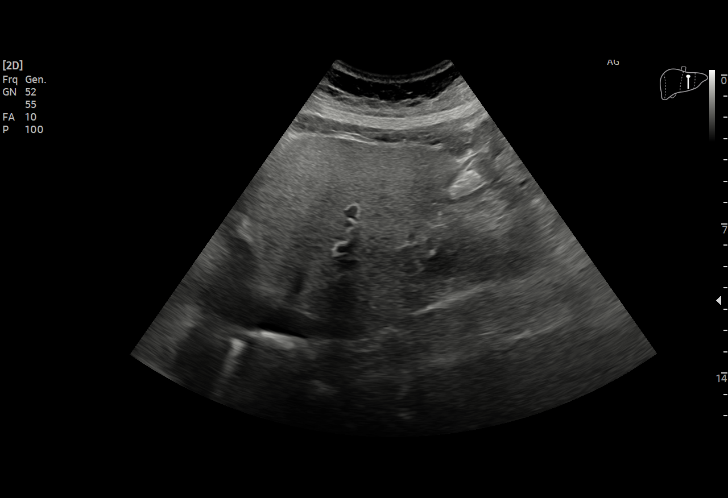
[im 18/31]
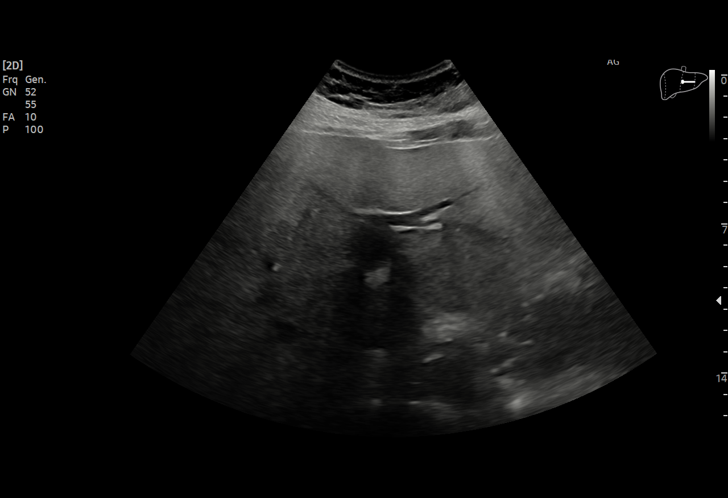
[im 19/31]
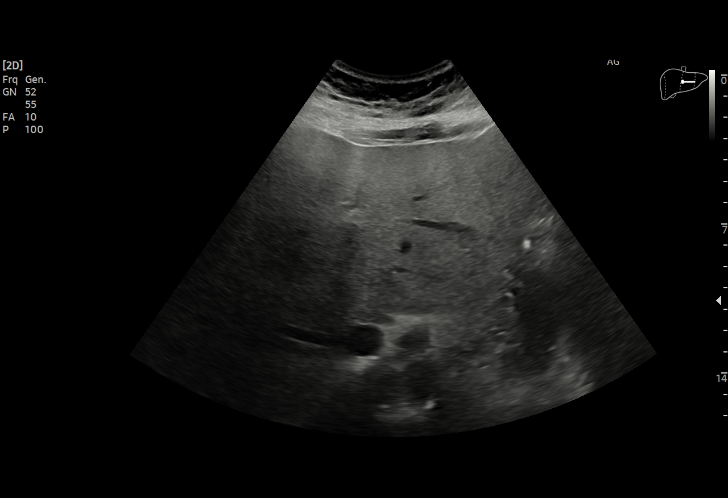
[im 22/31]
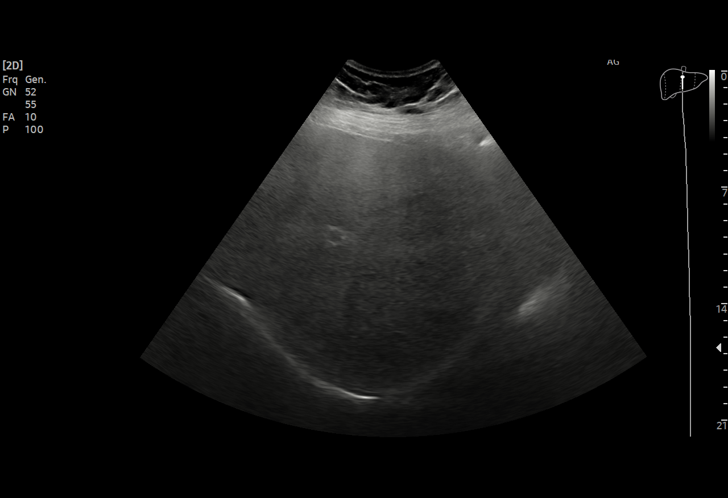
[im 24/31]
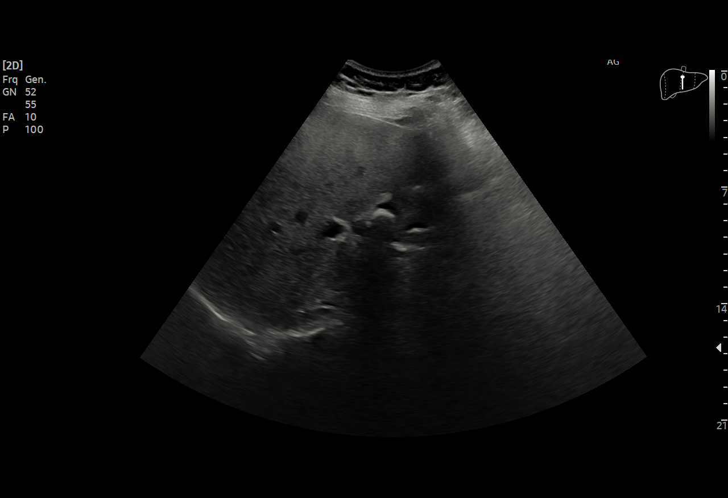
[im 26/31]
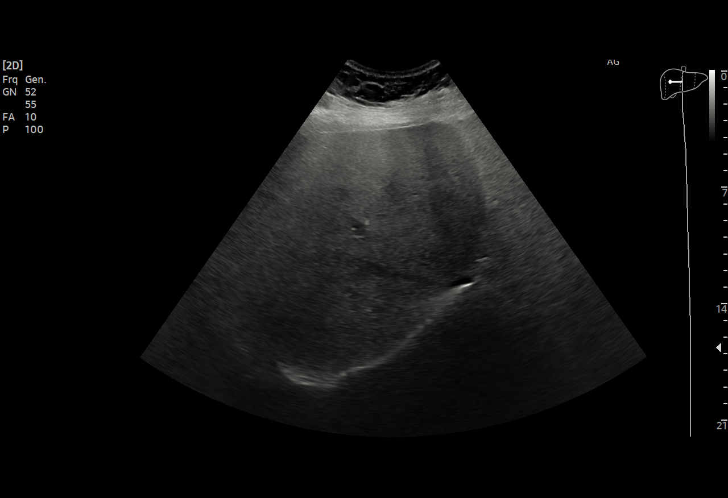
[im 28/31]
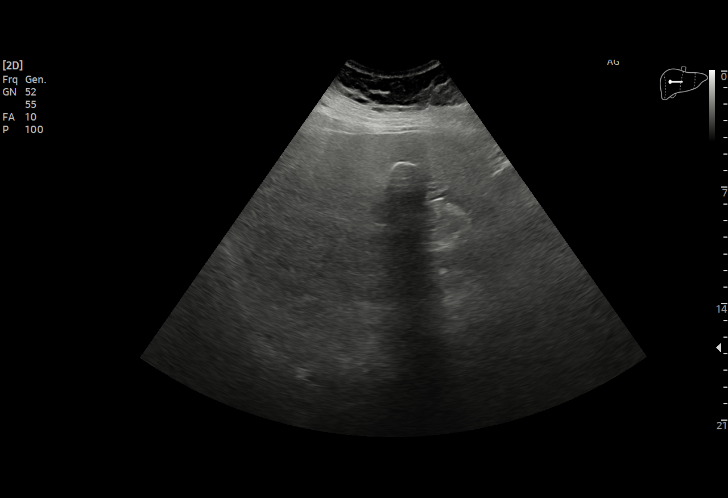
[im 31/31]
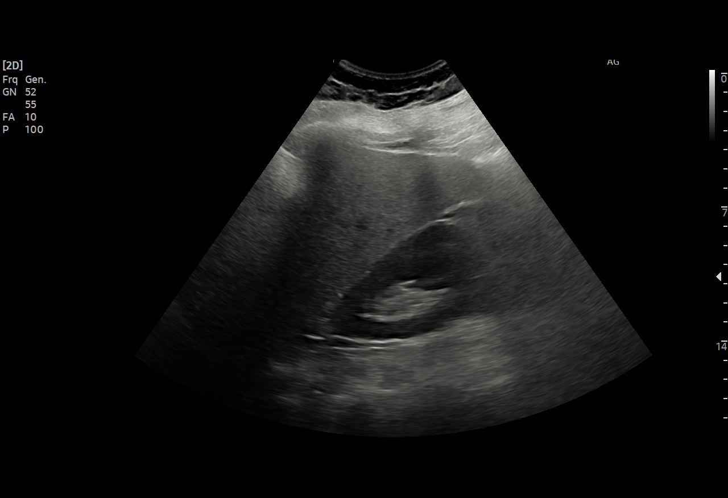

[15 of 25 positions shown; findings below may reference images not displayed]

FINDINGS: Gallbladder:

Cholelithiasis is noted without gallbladder wall thickening or
pericholecystic fluid. Largest calculus measures 1.5 cm. No
sonographic Murphy's sign is noted.

Common bile duct:

Diameter: 3 mm which is within normal limits.

Liver:

No focal lesion identified. Increased echogenicity of hepatic
parenchyma is noted suggesting hepatic steatosis. Portal vein is
patent on color Doppler imaging with normal direction of blood flow
towards the liver.

Other: None.
IMPRESSION: Cholelithiasis without cholecystitis.  Hepatic steatosis.

## 2022-05-11 ENCOUNTER — Ambulatory Visit (INDEPENDENT_AMBULATORY_CARE_PROVIDER_SITE_OTHER): Payer: Managed Care, Other (non HMO) | Admitting: Vascular Surgery

## 2022-05-11 ENCOUNTER — Encounter (INDEPENDENT_AMBULATORY_CARE_PROVIDER_SITE_OTHER): Payer: Self-pay | Admitting: Vascular Surgery

## 2022-05-11 DIAGNOSIS — E785 Hyperlipidemia, unspecified: Secondary | ICD-10-CM

## 2022-05-11 DIAGNOSIS — E119 Type 2 diabetes mellitus without complications: Secondary | ICD-10-CM

## 2022-05-11 DIAGNOSIS — I6521 Occlusion and stenosis of right carotid artery: Secondary | ICD-10-CM

## 2022-05-11 DIAGNOSIS — I10 Essential (primary) hypertension: Secondary | ICD-10-CM

## 2022-05-11 DIAGNOSIS — I6529 Occlusion and stenosis of unspecified carotid artery: Secondary | ICD-10-CM | POA: Insufficient documentation

## 2022-05-11 NOTE — Assessment & Plan Note (Signed)
blood pressure control important in reducing the progression of atherosclerotic disease. On appropriate oral medications.  

## 2022-05-11 NOTE — H&P (View-Only) (Signed)
Patient ID: Lauren Whitney, female   DOB: [birth year], 56 y.o.   MRN: [id number]  Chief Complaint  Patient presents with   Establish Care    Referred by Dr Burnett Sheng    HPI Lauren Whitney is a 56 y.o. female.  I am asked to see the patient by Dr. Burnett Sheng for evaluation of carotid artery disease.  Patient had a cyst on the back of her head which prompted a work-up with imaging modalities.  She has since had the cyst excised and is feeling much better.  She denies any focal symptoms of cerebrovascular ischemia. Specifically, the patient denies amaurosis fugax, speech or swallowing difficulties, or arm or leg weakness or numbness.  She first had a carotid duplex which suggested a very high-grade right carotid artery stenosis and no hemodynamically significant left carotid artery stenosis.  This was followed by CT angiogram which I have independently reviewed which demonstrates a high-grade greater than 75% right ICA stenosis and mild carotid stenosis on the left of less than 50%.  Given these findings, she is referred for further evaluation and treatment.  She is on a statin agent.   Past Medical History:  Diagnosis Date   Cholelithiases    Diabetes (HCC)    GERD (gastroesophageal reflux disease)    due to gallbladder-no meds   Hypertension    off meds x 2 years-better control now    Past Surgical History:  Procedure Laterality Date   CESAREAN SECTION     x2   WISDOM TOOTH EXTRACTION       Family History  Family history unknown: Yes      Social History   Tobacco Use   Smoking status: Every Day    Packs/day: 1.00    Years: 30.00    Total pack years: 30.00    Types: Cigarettes   Smokeless tobacco: Never  Vaping Use   Vaping Use: Never used  Substance Use Topics   Alcohol use: Never   Drug use: Never     Allergies  Allergen Reactions   Lisinopril Cough   Losartan Other (See Comments)    Blurred vision   Oxycodone-Acetaminophen Other (See Comments)    Severe  pain while taking     Current Outpatient Medications  Medication Sig Dispense Refill   acetaminophen (TYLENOL) 500 MG tablet Take 1,000 mg by mouth every 6 (six) hours as needed (for pain.).     atorvastatin (LIPITOR) 20 MG tablet Take 20 mg by mouth daily.     ibuprofen (ADVIL) 600 MG tablet Take 1 tablet (600 mg total) by mouth every 8 (eight) hours as needed for moderate pain. 60 tablet 1   metFORMIN (GLUCOPHAGE-XR) 500 MG 24 hr tablet Take 2,000 mg by mouth every evening.     SEMGLEE, YFGN, 100 UNIT/ML Pen Inject 40 Units into the skin at bedtime.     telmisartan (MICARDIS) 40 MG tablet Take 40 mg by mouth daily.     gabapentin (NEURONTIN) 300 MG capsule Take 1 capsule (300 mg total) by mouth 3 (three) times daily as needed for up to 10 days (headache, scalp pain). 30 capsule 0   No current facility-administered medications for this visit.      REVIEW OF SYSTEMS (Negative unless checked)  Constitutional: [] Weight loss  [] Fever  [] Chills Cardiac: [] Chest pain   [] Chest pressure   [] Palpitations   [] Shortness of breath when laying flat   [] Shortness of breath at rest   [] Shortness of breath with exertion.  Vascular:  [] Pain in legs with walking   [] Pain in legs at rest   [] Pain in legs when laying flat   [] Claudication   [] Pain in feet when walking  [] Pain in feet at rest  [] Pain in feet when laying flat   [] History of DVT   [] Phlebitis   [] Swelling in legs   [] Varicose veins   [] Non-healing ulcers Pulmonary:   [] Uses home oxygen   [] Productive cough   [] Hemoptysis   [] Wheeze  [] COPD   [] Asthma Neurologic:  [] Dizziness  [] Blackouts   [] Seizures   [] History of stroke   [] History of TIA  [] Aphasia   [] Temporary blindness   [] Dysphagia   [] Weakness or numbness in arms   [] Weakness or numbness in legs Musculoskeletal:  [] Arthritis   [] Joint swelling   [] Joint pain   [] Low back pain Hematologic:  [] Easy bruising  [] Easy bleeding   [] Hypercoagulable state   [] Anemic   [] Hepatitis Gastrointestinal:  [] Blood in stool   [] Vomiting blood  [x] Gastroesophageal reflux/heartburn   [x] Abdominal pain Genitourinary:  [] Chronic kidney disease   [] Difficult urination  [] Frequent urination  [] Burning with urination   [] Hematuria Skin:  [] Rashes   [] Ulcers   [] Wounds Psychological:  [] History of anxiety   []  History of major depression.    Physical Exam BP 118/71 (BP Location: Right Arm)   Pulse 89   Resp 18   Ht 5\' 5"  (1.651 m)   Wt 183 lb 6.4 oz (83.2 kg)   BMI 30.52 kg/m  Gen:  WD/WN, NAD Head: [place]/AT, No temporalis wasting.  Ear/Nose/Throat: Hearing grossly intact, nares w/o erythema or drainage, oropharynx w/o Erythema/Exudate Eyes: Conjunctiva clear, sclera non-icteric  Neck: trachea midline.  No JVD.  Pulmonary:  Good air movement, respirations not labored, no use of accessory muscles  Cardiac: RRR, no JVD Vascular:  Vessel Right Left  Radial Palpable Palpable                                   Gastrointestinal:. No masses, surgical incisions, or scars. Musculoskeletal: M/S 5/5 throughout.  Extremities without ischemic changes.  No deformity or atrophy. No edema. Neurologic: Sensation grossly intact in extremities.  Symmetrical.  Speech is fluent. Motor exam as listed above. Psychiatric: Judgment intact, Mood & affect appropriate for pt's clinical situation. Dermatologic: No rashes or ulcers noted.  No cellulitis or open wounds.    Radiology CT ANGIO NECK W OR WO CONTRAST  Result Date: 04/23/2022 CLINICAL DATA:  Abnormal carotid ultrasound EXAM: CT ANGIOGRAPHY NECK TECHNIQUE: Multidetector CT imaging of the neck was performed using the standard protocol during bolus administration of intravenous contrast. Multiplanar CT image reconstructions and MIPs were obtained to evaluate the vascular anatomy. Carotid stenosis measurements (when applicable) are obtained utilizing NASCET criteria, using the distal internal carotid diameter as the  denominator. RADIATION DOSE REDUCTION: This exam was performed according to the departmental dose-optimization program which includes automated exposure control, adjustment of the mA and/or kV according to patient size and/or use of iterative reconstruction technique. CONTRAST:  63mL OMNIPAQUE IOHEXOL 350 MG/ML SOLN COMPARISON:  No prior CTA available. FINDINGS: Aortic arch: 4 vessel arch, with the origin of the left vertebral artery originating from the aorta. Imaged portion shows no evidence of aneurysm or dissection. Evaluation of the vessel origins is somewhat limited by beam hardening artifact from the adjacent contrast bolus. Within this limitation, no significant stenosis of the major arch vessel origins. Mild aortic  atherosclerotic disease. Right carotid system: Calcified and noncalcified plaque in the distal common carotid artery results in approximately 40% stenosis. Focal area of severe stenosis in the proximal right ICA, with thread-like opacification (series 6, image 137 and series 8, image 69); however, as the distal vessel is diminutive, by NASCET criteria this is approximately 75-80% stenosis. The right ICA is otherwise patent to the skull base. No dissection or occlusion. Left carotid system: No evidence of dissection, occlusion, or hemodynamically significant stenosis (greater than 50%). Vertebral arteries: No evidence of dissection, occlusion, or hemodynamically significant stenosis (greater than 50%). Skeleton: No acute osseous abnormality.  Edentulous. Other neck: Subcentimeter hypoenhancing nodules in the thyroid, for which no follow-up is indicated. (Reference: J Am Coll Radiol. 2015 Feb;12(2): 143-50). Upper chest: No focal pulmonary opacity or pleural effusion. IMPRESSION: Focal area of severe stenosis in the proximal right ICA, with thread-like opacification. No other hemodynamically significant stenosis in the neck. Electronically Signed   By: Wiliam Ke M.D.   On: 04/23/2022 15:31     Labs Recent Results (from the past 2160 hour(s))  CBC with Differential     Status: Abnormal   Collection Time: 02/17/22  1:52 PM  Result Value Ref Range   WBC 12.6 (H) 4.0 - 10.5 K/uL   RBC 5.64 (H) 3.87 - 5.11 MIL/uL   Hemoglobin 15.7 (H) 12.0 - 15.0 g/dL   HCT 93.2 (H) 35.5 - 73.2 %   MCV 86.2 80.0 - 100.0 fL   MCH 27.8 26.0 - 34.0 pg   MCHC 32.3 30.0 - 36.0 g/dL   RDW 20.2 54.2 - 70.6 %   Platelets 387 150 - 400 K/uL   nRBC 0.0 0.0 - 0.2 %   Neutrophils Relative % 61 %   Neutro Abs 7.8 (H) 1.7 - 7.7 K/uL   Lymphocytes Relative 30 %   Lymphs Abs 3.8 0.7 - 4.0 K/uL   Monocytes Relative 5 %   Monocytes Absolute 0.6 0.1 - 1.0 K/uL   Eosinophils Relative 3 %   Eosinophils Absolute 0.3 0.0 - 0.5 K/uL   Basophils Relative 1 %   Basophils Absolute 0.1 0.0 - 0.1 K/uL   Immature Granulocytes 0 %   Abs Immature Granulocytes 0.04 0.00 - 0.07 K/uL    Comment: Performed at [facility], [address], [place], [place] [address]  Basic metabolic panel     Status: Abnormal   Collection Time: 02/17/22  1:52 PM  Result Value Ref Range   Sodium 140 135 - 145 mmol/L   Potassium 4.3 3.5 - 5.1 mmol/L   Chloride 107 98 - 111 mmol/L   CO2 26 22 - 32 mmol/L   Glucose, Bld 276 (H) 70 - 99 mg/dL    Comment: Glucose reference range applies only to samples taken after fasting for at least 8 hours.   BUN 9 6 - 20 mg/dL   Creatinine, Ser 8.31 0.44 - 1.00 mg/dL   Calcium 9.5 8.9 - 51.7 mg/dL   GFR, Estimated >61 >60 mL/min    Comment: (NOTE) Calculated using the CKD-EPI Creatinine Equation (2021)    Anion gap 7 5 - 15    Comment: Performed at [facility], [address], [place], [place] [address]    Assessment/Plan:  Carotid stenosis The patient has undergone a CT angiogram which I have independently reviewed which demonstrated is a greater than 75% right ICA stenosis.  The left carotid artery has very mild disease.  Although the patient is symptomatic, given  this  finding, she would clearly benefit from revascularization for stroke risk reduction.  Without a long discussion today regarding the differences between carotid artery stenting and carotid endarterectomy.  We discussed the risks and benefits between the 2 different procedures and how they differ from 1 another.  She would prefer carotid artery stenting which is very reasonable.  I will start her on Plavix today.  I have discussed controlled medical comorbidities and smoking cessation to improve her risk for vascular disease both in the carotid arteries and elsewhere.  We will proceed with carotid artery stenting in the near future  Essential hypertension, benign blood pressure control important in reducing the progression of atherosclerotic disease. On appropriate oral medications.   Diabetes (HCC) blood glucose control important in reducing the progression of atherosclerotic disease. Also, involved in wound healing. On appropriate medications.   Hyperlipidemia lipid control important in reducing the progression of atherosclerotic disease. Continue statin therapy      Festus Barren 05/11/2022, 2:39 PM   This note was created with Dragon medical transcription system.  Any errors from dictation are unintentional.

## 2022-05-11 NOTE — Assessment & Plan Note (Signed)
The patient has undergone a CT angiogram which I have independently reviewed which demonstrated is a greater than 75% right ICA stenosis.  The left carotid artery has very mild disease.  Although the patient is symptomatic, given this finding, she would clearly benefit from revascularization for stroke risk reduction.  Without a long discussion today regarding the differences between carotid artery stenting and carotid endarterectomy.  We discussed the risks and benefits between the 2 different procedures and how they differ from 1 another.  She would prefer carotid artery stenting which is very reasonable.  I will start her on Plavix today.  I have discussed controlled medical comorbidities and smoking cessation to improve her risk for vascular disease both in the carotid arteries and elsewhere.  We will proceed with carotid artery stenting in the near future

## 2022-05-11 NOTE — Assessment & Plan Note (Signed)
lipid control important in reducing the progression of atherosclerotic disease. Continue statin therapy  

## 2022-05-11 NOTE — Assessment & Plan Note (Signed)
blood glucose control important in reducing the progression of atherosclerotic disease. Also, involved in wound healing. On appropriate medications.  

## 2022-05-11 NOTE — Progress Notes (Signed)
Patient ID: [name], female   DOB: [birth year], 56 y.o.   MRN: [id number]  Chief Complaint  Patient presents with   Establish Care    Referred by Dr Lauren Whitney    HPI Lauren Whitney is a 56 y.o. female.  I am asked to see the patient by Dr. Burnett Whitney for evaluation of carotid artery disease.  Patient had a cyst on the back of her head which prompted a work-up with imaging modalities.  She has since had the cyst excised and is feeling much better.  She denies any focal symptoms of cerebrovascular ischemia. Specifically, the patient denies amaurosis fugax, speech or swallowing difficulties, or arm or leg weakness or numbness.  She first had a carotid duplex which suggested a very high-grade right carotid artery stenosis and no hemodynamically significant left carotid artery stenosis.  This was followed by CT angiogram which I have independently reviewed which demonstrates a high-grade greater than 75% right ICA stenosis and mild carotid stenosis on the left of less than 50%.  Given these findings, she is referred for further evaluation and treatment.  She is on a statin agent.   Past Medical History:  Diagnosis Date   Cholelithiases    Diabetes (HCC)    GERD (gastroesophageal reflux disease)    due to gallbladder-no meds   Hypertension    off meds x 2 years-better control now    Past Surgical History:  Procedure Laterality Date   CESAREAN SECTION     x2   WISDOM TOOTH EXTRACTION       Family History  Family history unknown: Yes      Social History   Tobacco Use   Smoking status: Every Day    Packs/day: 1.00    Years: 30.00    Total pack years: 30.00    Types: Cigarettes   Smokeless tobacco: Never  Vaping Use   Vaping Use: Never used  Substance Use Topics   Alcohol use: Never   Drug use: Never     Allergies  Allergen Reactions   Lisinopril Cough   Losartan Other (See Comments)    Blurred vision   Oxycodone-Acetaminophen Other (See Comments)    Severe  pain while taking     Current Outpatient Medications  Medication Sig Dispense Refill   acetaminophen (TYLENOL) 500 MG tablet Take 1,000 mg by mouth every 6 (six) hours as needed (for pain.).     atorvastatin (LIPITOR) 20 MG tablet Take 20 mg by mouth daily.     ibuprofen (ADVIL) 600 MG tablet Take 1 tablet (600 mg total) by mouth every 8 (eight) hours as needed for moderate pain. 60 tablet 1   metFORMIN (GLUCOPHAGE-XR) 500 MG 24 hr tablet Take 2,000 mg by mouth every evening.     SEMGLEE, YFGN, 100 UNIT/ML Pen Inject 40 Units into the skin at bedtime.     telmisartan (MICARDIS) 40 MG tablet Take 40 mg by mouth daily.     gabapentin (NEURONTIN) 300 MG capsule Take 1 capsule (300 mg total) by mouth 3 (three) times daily as needed for up to 10 days (headache, scalp pain). 30 capsule 0   No current facility-administered medications for this visit.      REVIEW OF SYSTEMS (Negative unless checked)  Constitutional: [] Weight loss  [] Fever  [] Chills Cardiac: [] Chest pain   [] Chest pressure   [] Palpitations   [] Shortness of breath when laying flat   [] Shortness of breath at rest   [] Shortness of breath with exertion.  Vascular:  [] Pain in legs with walking   [] Pain in legs at rest   [] Pain in legs when laying flat   [] Claudication   [] Pain in feet when walking  [] Pain in feet at rest  [] Pain in feet when laying flat   [] History of DVT   [] Phlebitis   [] Swelling in legs   [] Varicose veins   [] Non-healing ulcers Pulmonary:   [] Uses home oxygen   [] Productive cough   [] Hemoptysis   [] Wheeze  [] COPD   [] Asthma Neurologic:  [] Dizziness  [] Blackouts   [] Seizures   [] History of stroke   [] History of TIA  [] Aphasia   [] Temporary blindness   [] Dysphagia   [] Weakness or numbness in arms   [] Weakness or numbness in legs Musculoskeletal:  [] Arthritis   [] Joint swelling   [] Joint pain   [] Low back pain Hematologic:  [] Easy bruising  [] Easy bleeding   [] Hypercoagulable state   [] Anemic   [] Hepatitis Gastrointestinal:  [] Blood in stool   [] Vomiting blood  [x] Gastroesophageal reflux/heartburn   [x] Abdominal pain Genitourinary:  [] Chronic kidney disease   [] Difficult urination  [] Frequent urination  [] Burning with urination   [] Hematuria Skin:  [] Rashes   [] Ulcers   [] Wounds Psychological:  [] History of anxiety   []  History of major depression.    Physical Exam BP 118/71 (BP Location: Right Arm)   Pulse 89   Resp 18   Ht 5\' 5"  (1.651 m)   Wt 183 lb 6.4 oz (83.2 kg)   BMI 30.52 kg/m  Gen:  WD/WN, NAD Head: [place]/AT, No temporalis wasting.  Ear/Nose/Throat: Hearing grossly intact, nares w/o erythema or drainage, oropharynx w/o Erythema/Exudate Eyes: Conjunctiva clear, sclera non-icteric  Neck: trachea midline.  No JVD.  Pulmonary:  Good air movement, respirations not labored, no use of accessory muscles  Cardiac: RRR, no JVD Vascular:  Vessel Right Left  Radial Palpable Palpable                                   Gastrointestinal:. No masses, surgical incisions, or scars. Musculoskeletal: M/S 5/5 throughout.  Extremities without ischemic changes.  No deformity or atrophy. No edema. Neurologic: Sensation grossly intact in extremities.  Symmetrical.  Speech is fluent. Motor exam as listed above. Psychiatric: Judgment intact, Mood & affect appropriate for pt's clinical situation. Dermatologic: No rashes or ulcers noted.  No cellulitis or open wounds.    Radiology CT ANGIO NECK W OR WO CONTRAST  Result Date: 04/23/2022 CLINICAL DATA:  Abnormal carotid ultrasound EXAM: CT ANGIOGRAPHY NECK TECHNIQUE: Multidetector CT imaging of the neck was performed using the standard protocol during bolus administration of intravenous contrast. Multiplanar CT image reconstructions and MIPs were obtained to evaluate the vascular anatomy. Carotid stenosis measurements (when applicable) are obtained utilizing NASCET criteria, using the distal internal carotid diameter as the  denominator. RADIATION DOSE REDUCTION: This exam was performed according to the departmental dose-optimization program which includes automated exposure control, adjustment of the mA and/or kV according to patient size and/or use of iterative reconstruction technique. CONTRAST:  63mL OMNIPAQUE IOHEXOL 350 MG/ML SOLN COMPARISON:  No prior CTA available. FINDINGS: Aortic arch: 4 vessel arch, with the origin of the left vertebral artery originating from the aorta. Imaged portion shows no evidence of aneurysm or dissection. Evaluation of the vessel origins is somewhat limited by beam hardening artifact from the adjacent contrast bolus. Within this limitation, no significant stenosis of the major arch vessel origins. Mild aortic  atherosclerotic disease. Right carotid system: Calcified and noncalcified plaque in the distal common carotid artery results in approximately 40% stenosis. Focal area of severe stenosis in the proximal right ICA, with thread-like opacification (series 6, image 137 and series 8, image 69); however, as the distal vessel is diminutive, by NASCET criteria this is approximately 75-80% stenosis. The right ICA is otherwise patent to the skull base. No dissection or occlusion. Left carotid system: No evidence of dissection, occlusion, or hemodynamically significant stenosis (greater than 50%). Vertebral arteries: No evidence of dissection, occlusion, or hemodynamically significant stenosis (greater than 50%). Skeleton: No acute osseous abnormality.  Edentulous. Other neck: Subcentimeter hypoenhancing nodules in the thyroid, for which no follow-up is indicated. (Reference: J Am Coll Radiol. 2015 Feb;12(2): 143-50). Upper chest: No focal pulmonary opacity or pleural effusion. IMPRESSION: Focal area of severe stenosis in the proximal right ICA, with thread-like opacification. No other hemodynamically significant stenosis in the neck. Electronically Signed   By: Wiliam Ke M.D.   On: 04/23/2022 15:31     Labs Recent Results (from the past 2160 hour(s))  CBC with Differential     Status: Abnormal   Collection Time: 02/17/22  1:52 PM  Result Value Ref Range   WBC 12.6 (H) 4.0 - 10.5 K/uL   RBC 5.64 (H) 3.87 - 5.11 MIL/uL   Hemoglobin 15.7 (H) 12.0 - 15.0 g/dL   HCT 93.2 (H) 35.5 - 73.2 %   MCV 86.2 80.0 - 100.0 fL   MCH 27.8 26.0 - 34.0 pg   MCHC 32.3 30.0 - 36.0 g/dL   RDW 20.2 54.2 - 70.6 %   Platelets 387 150 - 400 K/uL   nRBC 0.0 0.0 - 0.2 %   Neutrophils Relative % 61 %   Neutro Abs 7.8 (H) 1.7 - 7.7 K/uL   Lymphocytes Relative 30 %   Lymphs Abs 3.8 0.7 - 4.0 K/uL   Monocytes Relative 5 %   Monocytes Absolute 0.6 0.1 - 1.0 K/uL   Eosinophils Relative 3 %   Eosinophils Absolute 0.3 0.0 - 0.5 K/uL   Basophils Relative 1 %   Basophils Absolute 0.1 0.0 - 0.1 K/uL   Immature Granulocytes 0 %   Abs Immature Granulocytes 0.04 0.00 - 0.07 K/uL    Comment: Performed at [facility], [address], [place], [place] [address]  Basic metabolic panel     Status: Abnormal   Collection Time: 02/17/22  1:52 PM  Result Value Ref Range   Sodium 140 135 - 145 mmol/L   Potassium 4.3 3.5 - 5.1 mmol/L   Chloride 107 98 - 111 mmol/L   CO2 26 22 - 32 mmol/L   Glucose, Bld 276 (H) 70 - 99 mg/dL    Comment: Glucose reference range applies only to samples taken after fasting for at least 8 hours.   BUN 9 6 - 20 mg/dL   Creatinine, Ser 8.31 0.44 - 1.00 mg/dL   Calcium 9.5 8.9 - 51.7 mg/dL   GFR, Estimated >61 >60 mL/min    Comment: (NOTE) Calculated using the CKD-EPI Creatinine Equation (2021)    Anion gap 7 5 - 15    Comment: Performed at [facility], [address], [place], [place] [address]    Assessment/Plan:  Carotid stenosis The patient has undergone a CT angiogram which I have independently reviewed which demonstrated is a greater than 75% right ICA stenosis.  The left carotid artery has very mild disease.  Although the patient is symptomatic, given  this  finding, she would clearly benefit from revascularization for stroke risk reduction.  Without a long discussion today regarding the differences between carotid artery stenting and carotid endarterectomy.  We discussed the risks and benefits between the 2 different procedures and how they differ from 1 another.  She would prefer carotid artery stenting which is very reasonable.  I will start her on Plavix today.  I have discussed controlled medical comorbidities and smoking cessation to improve her risk for vascular disease both in the carotid arteries and elsewhere.  We will proceed with carotid artery stenting in the near future  Essential hypertension, benign blood pressure control important in reducing the progression of atherosclerotic disease. On appropriate oral medications.   Diabetes (HCC) blood glucose control important in reducing the progression of atherosclerotic disease. Also, involved in wound healing. On appropriate medications.   Hyperlipidemia lipid control important in reducing the progression of atherosclerotic disease. Continue statin therapy      Lauren Whitney 05/11/2022, 2:39 PM   This note was created with Dragon medical transcription system.  Any errors from dictation are unintentional.

## 2022-06-01 ENCOUNTER — Telehealth (INDEPENDENT_AMBULATORY_CARE_PROVIDER_SITE_OTHER): Payer: Self-pay

## 2022-06-01 NOTE — Telephone Encounter (Signed)
Spoke with the patient on 05/27/22 and she is scheduled with Dr. Lucky Cowboy on 06/07/22 with a 9:00 am arrival time to the MM. For a right carotid stent placement. Pre-procedure instructions were discussed and will be mailed.

## 2022-06-07 ENCOUNTER — Encounter
Admission: RE | Disposition: A | Discharge status: Home / Self Care | Payer: Self-pay | Source: Home / Self Care | Attending: Vascular Surgery

## 2022-06-07 ENCOUNTER — Encounter: Payer: Self-pay | Admitting: Vascular Surgery

## 2022-06-07 ENCOUNTER — Inpatient Hospital Stay
Admission: RE | Admit: 2022-06-07 | Discharge: 2022-06-08 | DRG: 036 | Disposition: A | Discharge status: Home / Self Care | Payer: Managed Care, Other (non HMO) | Attending: Vascular Surgery | Admitting: Vascular Surgery

## 2022-06-07 ENCOUNTER — Other Ambulatory Visit: Payer: Self-pay

## 2022-06-07 DIAGNOSIS — Z794 Long term (current) use of insulin: Secondary | ICD-10-CM

## 2022-06-07 DIAGNOSIS — E785 Hyperlipidemia, unspecified: Secondary | ICD-10-CM | POA: Diagnosis present

## 2022-06-07 DIAGNOSIS — K219 Gastro-esophageal reflux disease without esophagitis: Secondary | ICD-10-CM | POA: Diagnosis present

## 2022-06-07 DIAGNOSIS — Z888 Allergy status to other drugs, medicaments and biological substances status: Secondary | ICD-10-CM

## 2022-06-07 DIAGNOSIS — Z7984 Long term (current) use of oral hypoglycemic drugs: Secondary | ICD-10-CM

## 2022-06-07 DIAGNOSIS — E119 Type 2 diabetes mellitus without complications: Secondary | ICD-10-CM | POA: Diagnosis present

## 2022-06-07 DIAGNOSIS — Z79899 Other long term (current) drug therapy: Secondary | ICD-10-CM | POA: Diagnosis not present

## 2022-06-07 DIAGNOSIS — I6521 Occlusion and stenosis of right carotid artery: Secondary | ICD-10-CM | POA: Diagnosis present

## 2022-06-07 DIAGNOSIS — I10 Essential (primary) hypertension: Secondary | ICD-10-CM | POA: Diagnosis present

## 2022-06-07 DIAGNOSIS — F17210 Nicotine dependence, cigarettes, uncomplicated: Secondary | ICD-10-CM | POA: Diagnosis present

## 2022-06-07 DIAGNOSIS — Z881 Allergy status to other antibiotic agents status: Secondary | ICD-10-CM | POA: Diagnosis not present

## 2022-06-07 DIAGNOSIS — Z885 Allergy status to narcotic agent status: Secondary | ICD-10-CM | POA: Diagnosis not present

## 2022-06-07 HISTORY — PX: CAROTID PTA/STENT INTERVENTION: CATH118231

## 2022-06-07 LAB — GLUCOSE, CAPILLARY
Glucose-Capillary: 206 mg/dL — ABNORMAL HIGH (ref 70–99)
Glucose-Capillary: 188 mg/dL — ABNORMAL HIGH (ref 70–99)
Glucose-Capillary: 162 mg/dL — ABNORMAL HIGH (ref 70–99)
Glucose-Capillary: 183 mg/dL — ABNORMAL HIGH (ref 70–99)

## 2022-06-07 LAB — CREATININE, SERUM
Creatinine, Ser: 0.57 mg/dL (ref 0.44–1.00)
GFR, Estimated: >60 mL/min (ref >60)

## 2022-06-07 LAB — BUN: BUN: 16 mg/dL (ref 6–20)

## 2022-06-07 LAB — MRSA NEXT GEN BY PCR, NASAL: MRSA by PCR Next Gen: NOT DETECTED

## 2022-06-07 LAB — POCT ACTIVATED CLOTTING TIME: Activated Clotting Time: 305 seconds

## 2022-06-07 SURGERY — CAROTID PTA/STENT INTERVENTION
Anesthesia: Moderate Sedation | Laterality: Right

## 2022-06-07 MED ORDER — MIDAZOLAM HCL 2 MG/ML PO SYRP: 8.0000 mg | ORAL_SOLUTION | Freq: Once | ORAL | Status: DC | PRN

## 2022-06-07 MED ORDER — GABAPENTIN 300 MG PO CAPS
300.0000 mg | ORAL_CAPSULE | Freq: Three times a day (TID) | ORAL | Status: DC | PRN
  Administered 2022-06-08: 300 mg via ORAL
  Filled 2022-06-07: qty 1

## 2022-06-07 MED ORDER — METOPROLOL TARTRATE 5 MG/5ML IV SOLN: 2.0000 mg | INTRAVENOUS | Status: DC | PRN

## 2022-06-07 MED ORDER — GUAIFENESIN-DM 100-10 MG/5ML PO SYRP: 15.0000 mL | ORAL_SOLUTION | ORAL | Status: DC | PRN

## 2022-06-07 MED ORDER — MIDAZOLAM HCL 2 MG/2ML IJ SOLN
INTRAMUSCULAR | Status: DC | PRN
  Administered 2022-06-07: 2 mg via INTRAVENOUS

## 2022-06-07 MED ORDER — HEPARIN SODIUM (PORCINE) 1000 UNIT/ML IJ SOLN
INTRAMUSCULAR | Status: AC
  Filled 2022-06-07: qty 10

## 2022-06-07 MED ORDER — SODIUM CHLORIDE 0.9 % IV SOLN: INTRAVENOUS | Status: DC

## 2022-06-07 MED ORDER — IODIXANOL 320 MG/ML IV SOLN
INTRAVENOUS | Status: DC | PRN
  Administered 2022-06-07: 40 mL

## 2022-06-07 MED ORDER — MAGNESIUM SULFATE 2 GM/50ML IV SOLN: 2.0000 g | Freq: Every day | INTRAVENOUS | Status: DC | PRN

## 2022-06-07 MED ORDER — SODIUM CHLORIDE 0.9 % IV SOLN
500.0000 mL | Freq: Once | INTRAVENOUS | Status: AC | PRN
  Administered 2022-06-07 (×2): 500 mL via INTRAVENOUS

## 2022-06-07 MED ORDER — DIPHENHYDRAMINE HCL 50 MG/ML IJ SOLN: 50.0000 mg | Freq: Once | INTRAMUSCULAR | Status: DC | PRN

## 2022-06-07 MED ORDER — ONDANSETRON HCL 4 MG/2ML IJ SOLN
4.0000 mg | Freq: Four times a day (QID) | INTRAMUSCULAR | Status: DC | PRN
Start: 2022-06-07 — End: 2022-06-08

## 2022-06-07 MED ORDER — CHLORHEXIDINE GLUCONATE CLOTH 2 % EX PADS: 6.0000 | MEDICATED_PAD | Freq: Every day | CUTANEOUS | Status: DC

## 2022-06-07 MED ORDER — LABETALOL HCL 5 MG/ML IV SOLN: 10.0000 mg | INTRAVENOUS | Status: DC | PRN

## 2022-06-07 MED ORDER — FENTANYL CITRATE (PF) 100 MCG/2ML IJ SOLN
INTRAMUSCULAR | Status: DC | PRN
  Administered 2022-06-07: 50 ug via INTRAVENOUS

## 2022-06-07 MED ORDER — DOPAMINE-DEXTROSE 3.2-5 MG/ML-% IV SOLN
INTRAVENOUS | Status: AC
  Administered 2022-06-07: 3 ug/kg/min via INTRAVENOUS
  Filled 2022-06-07: qty 250

## 2022-06-07 MED ORDER — ATROPINE SULFATE 1 MG/10ML IJ SOSY
PREFILLED_SYRINGE | INTRAMUSCULAR | Status: AC
  Filled 2022-06-07: qty 10

## 2022-06-07 MED ORDER — ASPIRIN 81 MG PO TBEC
81.0000 mg | DELAYED_RELEASE_TABLET | Freq: Every day | ORAL | Status: DC
  Administered 2022-06-08: 81 mg via ORAL
  Filled 2022-06-07: qty 1

## 2022-06-07 MED ORDER — CEFAZOLIN SODIUM-DEXTROSE 2-4 GM/100ML-% IV SOLN: 2.0000 g | INTRAVENOUS | Status: DC

## 2022-06-07 MED ORDER — ATORVASTATIN CALCIUM 20 MG PO TABS
20.0000 mg | ORAL_TABLET | Freq: Every evening | ORAL | Status: DC
  Administered 2022-06-07: 20 mg via ORAL
  Filled 2022-06-07: qty 1

## 2022-06-07 MED ORDER — INSULIN ASPART 100 UNIT/ML IJ SOLN
0.0000 [IU] | Freq: Three times a day (TID) | INTRAMUSCULAR | Status: DC
  Administered 2022-06-08: 2 [IU] via SUBCUTANEOUS
  Filled 2022-06-07: qty 1

## 2022-06-07 MED ORDER — CLOPIDOGREL BISULFATE 75 MG PO TABS
75.0000 mg | ORAL_TABLET | Freq: Every day | ORAL | Status: DC
  Administered 2022-06-08: 75 mg via ORAL
  Filled 2022-06-07: qty 1

## 2022-06-07 MED ORDER — IRBESARTAN 150 MG PO TABS: 150.0000 mg | ORAL_TABLET | Freq: Every day | ORAL | Status: DC

## 2022-06-07 MED ORDER — FENTANYL CITRATE (PF) 100 MCG/2ML IJ SOLN
INTRAMUSCULAR | Status: AC
  Filled 2022-06-07: qty 2

## 2022-06-07 MED ORDER — MORPHINE SULFATE (PF) 4 MG/ML IV SOLN: 2.0000 mg | INTRAVENOUS | Status: DC | PRN

## 2022-06-07 MED ORDER — MIDAZOLAM HCL 2 MG/2ML IJ SOLN
INTRAMUSCULAR | Status: AC
  Filled 2022-06-07: qty 2

## 2022-06-07 MED ORDER — ACETAMINOPHEN 325 MG RE SUPP: 325.0000 mg | RECTAL | Status: DC | PRN

## 2022-06-07 MED ORDER — PHENYLEPHRINE HCL-NACL 20-0.9 MG/250ML-% IV SOLN: 25.0000 ug/min | INTRAVENOUS | Status: DC

## 2022-06-07 MED ORDER — PHENYLEPHRINE HCL-NACL 20-0.9 MG/250ML-% IV SOLN: 0.0000 ug/min | INTRAVENOUS | Status: DC

## 2022-06-07 MED ORDER — PHENYLEPHRINE HCL (PRESSORS) 10 MG/ML IV SOLN
INTRAVENOUS | Status: AC
  Filled 2022-06-07: qty 1

## 2022-06-07 MED ORDER — FAMOTIDINE IN NACL 20-0.9 MG/50ML-% IV SOLN
20.0000 mg | Freq: Two times a day (BID) | INTRAVENOUS | Status: DC
  Administered 2022-06-07: 20 mg via INTRAVENOUS
  Filled 2022-06-07: qty 50

## 2022-06-07 MED ORDER — PHENOL 1.4 % MT LIQD: 1.0000 | OROMUCOSAL | Status: DC | PRN

## 2022-06-07 MED ORDER — ONDANSETRON HCL 4 MG/2ML IJ SOLN: 4.0000 mg | Freq: Four times a day (QID) | INTRAMUSCULAR | Status: DC | PRN

## 2022-06-07 MED ORDER — PHENYLEPHRINE 80 MCG/ML (10ML) SYRINGE FOR IV PUSH (FOR BLOOD PRESSURE SUPPORT)
PREFILLED_SYRINGE | INTRAVENOUS | Status: AC
  Filled 2022-06-07: qty 10

## 2022-06-07 MED ORDER — FAMOTIDINE 20 MG PO TABS: 40.0000 mg | ORAL_TABLET | Freq: Once | ORAL | Status: DC | PRN

## 2022-06-07 MED ORDER — PHENYLEPHRINE HCL-NACL 20-0.9 MG/250ML-% IV SOLN
INTRAVENOUS | Status: AC
  Filled 2022-06-07: qty 250

## 2022-06-07 MED ORDER — VANCOMYCIN HCL IN DEXTROSE 1-5 GM/200ML-% IV SOLN
1000.0000 mg | Freq: Two times a day (BID) | INTRAVENOUS | Status: AC
  Administered 2022-06-07 – 2022-06-08 (×2): 1000 mg via INTRAVENOUS
  Filled 2022-06-07 (×2): qty 200

## 2022-06-07 MED ORDER — SODIUM CHLORIDE 0.9 % IV SOLN: 250.0000 mL | INTRAVENOUS | Status: DC

## 2022-06-07 MED ORDER — IBUPROFEN 400 MG PO TABS: 600.0000 mg | ORAL_TABLET | Freq: Three times a day (TID) | ORAL | Status: DC | PRN

## 2022-06-07 MED ORDER — ACETAMINOPHEN 500 MG PO TABS
1000.0000 mg | ORAL_TABLET | Freq: Four times a day (QID) | ORAL | Status: DC | PRN
  Administered 2022-06-08: 1000 mg via ORAL
  Filled 2022-06-07: qty 2

## 2022-06-07 MED ORDER — HYDRALAZINE HCL 20 MG/ML IJ SOLN: 5.0000 mg | INTRAMUSCULAR | Status: DC | PRN

## 2022-06-07 MED ORDER — POTASSIUM CHLORIDE CRYS ER 20 MEQ PO TBCR: 20.0000 meq | EXTENDED_RELEASE_TABLET | Freq: Every day | ORAL | Status: DC | PRN

## 2022-06-07 MED ORDER — HYDROMORPHONE HCL 1 MG/ML IJ SOLN: 1.0000 mg | Freq: Once | INTRAMUSCULAR | Status: DC | PRN

## 2022-06-07 MED ORDER — INSULIN GLARGINE-YFGN 100 UNIT/ML ~~LOC~~ SOLN
40.0000 [IU] | Freq: Every day | SUBCUTANEOUS | Status: DC
  Administered 2022-06-07: 40 [IU] via SUBCUTANEOUS
  Filled 2022-06-07: qty 0.4

## 2022-06-07 MED ORDER — METHYLPREDNISOLONE SODIUM SUCC 125 MG IJ SOLR: 125.0000 mg | Freq: Once | INTRAMUSCULAR | Status: DC | PRN

## 2022-06-07 MED ORDER — ACETAMINOPHEN 325 MG PO TABS: 325.0000 mg | ORAL_TABLET | ORAL | Status: DC | PRN

## 2022-06-07 MED ORDER — HEPARIN SODIUM (PORCINE) 1000 UNIT/ML IJ SOLN
INTRAMUSCULAR | Status: DC | PRN
  Administered 2022-06-07: 8000 [IU] via INTRAVENOUS

## 2022-06-07 MED ORDER — VANCOMYCIN HCL IN DEXTROSE 1-5 GM/200ML-% IV SOLN
1000.0000 mg | Freq: Once | INTRAVENOUS | Status: AC
  Administered 2022-06-07: 1000 mg via INTRAVENOUS
  Filled 2022-06-07: qty 200

## 2022-06-07 MED ORDER — DOPAMINE-DEXTROSE 3.2-5 MG/ML-% IV SOLN: 0.0000 ug/kg/min | INTRAVENOUS | Status: DC

## 2022-06-07 MED ORDER — ALUM & MAG HYDROXIDE-SIMETH 200-200-20 MG/5ML PO SUSP: 15.0000 mL | ORAL | Status: DC | PRN

## 2022-06-07 SURGICAL SUPPLY — 20 items
BALLN VTRAC 4.5X20X135 (BALLOONS) ×1
BALLOON VTRAC 4.5X20X135 (BALLOONS) IMPLANT
CATH ANGIO 5F PIGTAIL 100CM (CATHETERS) IMPLANT
CATH HEADHUNTER H1 5F 100CM (CATHETERS) IMPLANT
COVER DRAPE FLUORO 36X44 (DRAPES) IMPLANT
COVER PROBE ULTRASOUND 5X96 (MISCELLANEOUS) IMPLANT
DEVICE EMBOSHIELD NAV6 4.0-7.0 (FILTER) IMPLANT
DEVICE SAFEGUARD 24CM (GAUZE/BANDAGES/DRESSINGS) IMPLANT
DEVICE STARCLOSE SE CLOSURE (Vascular Products) IMPLANT
DEVICE TORQUE (MISCELLANEOUS) IMPLANT
GLIDEWIRE ANGLED SS 035X260CM (WIRE) IMPLANT
GUIDEWIRE VASC STIFF .038X260 (WIRE) IMPLANT
KIT CAROTID MANIFOLD (MISCELLANEOUS) IMPLANT
KIT ENCORE 26 ADVANTAGE (KITS) IMPLANT
PACK ANGIOGRAPHY (CUSTOM PROCEDURE TRAY) ×1 IMPLANT
SHEATH BRITE TIP 6FRX11 (SHEATH) IMPLANT
SHEATH SHUTTLE 6FR (SHEATH) IMPLANT
STENT XACT CAR 9-7X30X136 (Permanent Stent) IMPLANT
SYR MEDRAD MARK 7 150ML (SYRINGE) IMPLANT
WIRE GUIDERIGHT .035X150 (WIRE) IMPLANT

## 2022-06-07 NOTE — Interval H&P Note (Signed)
History and Physical Interval Note:  06/07/2022 8:56 AM  Lauren Whitney  has presented today for surgery, with the diagnosis of R Carotid Stent   ABBOTT  Carotid artery stenosis.  The various methods of treatment have been discussed with the patient and family. After consideration of risks, benefits and other options for treatment, the patient has consented to  Procedure(s): CAROTID PTA/STENT INTERVENTION (Right) as a surgical intervention.  The patient's history has been reviewed, patient examined, no change in status, stable for surgery.  I have reviewed the patient's chart and labs.  Questions were answered to the patient's satisfaction.     Leotis Pain

## 2022-06-07 NOTE — Progress Notes (Signed)
Heparin given by Annia Friendly, RN. Prior to admininstration of Heparin the Vancomycin was paused, the line flushed and then flushed again after the administration of the Heparin. Vancomycin then resumed at ordered rate.

## 2022-06-07 NOTE — Op Note (Signed)
OPERATIVE NOTE DATE: 06/07/2022  PROCEDURE:  Ultrasound guidance for vascular access right femoral artery  Placement of a 9 mm proximal, 7 mm distal 3 cm long Exact stent with the use of the NAV-6 embolic protection device in the right carotid artery  PRE-OPERATIVE DIAGNOSIS: 1. High grade right carotid artery stenosis.   POST-OPERATIVE DIAGNOSIS:  Same as above  SURGEON: Leotis Pain, MD  ASSISTANT(S):  none  ANESTHESIA: local/MCS  ESTIMATED BLOOD LOSS:  20 cc  CONTRAST: 50 cc  FLUORO TIME: 4.8 minutes  MODERATE CONSCIOUS SEDATION TIME:  Approximately 34 minutes using 2 mg of Versed and 50 mcg of Fentanyl  FINDING(S): 1.   95% carotid artery stenosis  SPECIMEN(S):   none  INDICATIONS:   Patient is a 56 y.o. female who presents with high grade right carotid artery stenosis.  The patient was discussed risks between carotid artery stenting and carotid endarterectomy and we like to proceed with carotid stenting.  Risks and benefits were discussed and informed consent was obtained.   DESCRIPTION: After obtaining full informed written consent, the patient was brought back to the vascular suite and placed supine upon the table.  The patient received IV antibiotics prior to induction. Moderate conscious sedation was administered during a face to face encounter with the patient throughout the procedure with my supervision of the RN administering medicines and monitoring the patients vital signs and mental status throughout from the start of the procedure until the patient was taken to the recovery room.  After obtaining adequate anesthesia, the patient was prepped and draped in the standard fashion.   The right femoral artery was visualized with ultrasound and found to be widely patent. It was then accessed under direct ultrasound guidance without difficulty with a Seldinger needle. A permanent image was recorded. A J-wire was placed and we then placed a 6 French sheath. The patient was  then heparinized and a total of 8000 units of intravenous heparin were given and an ACT was checked to confirm successful anticoagulation. A pigtail catheter was then placed into the ascending aorta. This showed a type II aortic arch with mild atherosclerotic disease proximally and a separate origin of the left vertebral artery which was fairly large. I then selectively cannulated the innominate artery without difficulty with a H1 catheter and advanced into the mid right common carotid artery.  Cervical and cerebral carotid angiography was then performed. There was sluggish middle cerebral artery flow on the right with no visible anterior cerebral artery on initial imaging. The carotid bifurcation demonstrated a very high-grade stenosis of 95% or greater in the proximal right internal carotid artery with tortuosity of the distal internal carotid artery above the lesion but not at the lesion.  I then advanced into the external carotid artery with a Glidewire and the H1 catheter and then exchanged for the Amplatz Super Stiff wire. Over the Amplatz Super Stiff wire, a 6 [place] shuttle sheath was placed into the mid common carotid artery. I then used the NAV-6  Embolic protection device and crossed the lesion and parked this in the distal internal carotid artery.  I then selected a 9 mm proximal 7 mm distal 3 cm long Exact stent. This was deployed across the lesion encompassing it in its entirety. A 4.5 mm diameter x 2 cm length balloon was used to post dilate the stent. Only about a 15% residual stenosis was present after angioplasty. Completion angiogram showed normal intracranial filling without new defects. At this point I elected  to terminate the procedure. The sheath was removed and StarClose closure device was deployed in the right femoral artery with excellent hemostatic result. The patient was taken to the recovery room in stable condition having tolerated the procedure well.  COMPLICATIONS:  none  CONDITION: stable  Leotis Pain 06/07/2022 11:36 AM   This note was created with Dragon Medical transcription system. Any errors in dictation are purely unintentional.

## 2022-06-08 LAB — BASIC METABOLIC PANEL WITH GFR
Anion gap: 6 (ref 5–15)
BUN: 12 mg/dL (ref 6–20)
CO2: 24 mmol/L (ref 22–32)
Calcium: 8.4 mg/dL — ABNORMAL LOW (ref 8.9–10.3)
Chloride: 109 mmol/L (ref 98–111)
Creatinine, Ser: 0.48 mg/dL (ref 0.44–1.00)
GFR, Estimated: >60 mL/min
Glucose, Bld: 263 mg/dL — ABNORMAL HIGH (ref 70–99)
Potassium: 4 mmol/L (ref 3.5–5.1)
Sodium: 139 mmol/L (ref 135–145)

## 2022-06-08 LAB — HEMOGLOBIN A1C
Hgb A1c MFr Bld: 8.1 % — ABNORMAL HIGH (ref 4.8–5.6)
Mean Plasma Glucose: 185.77 mg/dL

## 2022-06-08 LAB — CBC
HCT: 36.7 % (ref 36.0–46.0)
Hemoglobin: 12 g/dL (ref 12.0–15.0)
MCH: 28.4 pg (ref 26.0–34.0)
MCHC: 32.7 g/dL (ref 30.0–36.0)
MCV: 87 fL (ref 80.0–100.0)
Platelets: 338 K/uL (ref 150–400)
RBC: 4.22 MIL/uL (ref 3.87–5.11)
RDW: 14.4 % (ref 11.5–15.5)
WBC: 12.5 K/uL — ABNORMAL HIGH (ref 4.0–10.5)
nRBC: 0 % (ref 0.0–0.2)

## 2022-06-08 LAB — GLUCOSE, CAPILLARY: Glucose-Capillary: 198 mg/dL — ABNORMAL HIGH (ref 70–99)

## 2022-06-08 MED ORDER — FAMOTIDINE 20 MG PO TABS
20.0000 mg | ORAL_TABLET | Freq: Every day | ORAL | Status: DC
  Administered 2022-06-08: 20 mg via ORAL
  Filled 2022-06-08: qty 1

## 2022-06-08 MED ORDER — BLOOD GLUCOSE MONITORING SUPPL KIT: 1.0000 | PACK | Freq: Every day | 0 refills | Status: AC | PRN

## 2022-06-08 MED ORDER — TRAMADOL HCL 50 MG PO TABS: 50.0000 mg | ORAL_TABLET | Freq: Four times a day (QID) | ORAL | 0 refills | Status: DC | PRN

## 2022-06-08 NOTE — TOC CM/SW Note (Signed)
Patient has orders to discharge home today. Chart reviewed. No TOC needs identified. CSW signing off.  Waddell Iten, CSW [phone]  

## 2022-06-08 NOTE — Progress Notes (Signed)
1045 Discharge teaching done. Questions answered. Follow up appointment made. Discharged home with husband.

## 2022-06-08 NOTE — Discharge Summary (Signed)
[facility]    Discharge Summary    Patient ID:  Lauren Whitney MRN: [id number] DOB/AGE: [birth year] 56 y.o.  Admit date: 06/07/2022 Discharge date: 06/08/2022 Date of Surgery: 06/07/2022 Surgeon: Surgeon(s): Algernon Huxley, MD  Admission Diagnosis: Carotid stenosis, right [I65.21]  Discharge Diagnoses:  Carotid stenosis, right [I65.21]  Secondary Diagnoses: Past Medical History:  Diagnosis Date   Cholelithiases    Diabetes ([place])    GERD (gastroesophageal reflux disease)    due to gallbladder-no meds   Hypertension    off meds x 2 years-better control now    Procedure(s): CAROTID PTA/STENT INTERVENTION  Discharged Condition: good  HPI:  Lauren Whitney is a 56 year old female who presented on 06/07/2022 with a high-grade right carotid artery stenosis.  It was estimated that the patient had a 95% stenosis.  She had a right carotid stent placed.  The patient did well overnight.  No neurological issues.  Some slight groin soreness but overall doing well.  Hospital Course:  Lauren Whitney is a 56 y.o. female is S/P Right carotid stent placement Extubated: POD # 0 Physical Exam:  Alert notes x3, no acute distress Face: Symmetrical.  Tongue is midline. Neck: Trachea is midline.  No swelling or bruising. Cardiovascular: Regular rate and rhythm Pulmonary: Clear to auscultation bilaterally Abdomen: Soft, nontender, nondistended Right groin access: Clean dry and intact.  No swelling or drainage noted Left groin access: Clean dry and intact.  No swelling or drainage noted Left lower extremity: Thigh soft.  Calf soft.  Extremities warm distally toes.  Hard to palpate pedal pulses however the foot is warm is her good capillary refill. Right lower extremity: Thigh soft.  Calf soft.  Extremities warm distally toes.  Hard to palpate pedal pulses however the foot is warm is her good capillary refill. Neurological: No deficits noted   Post-op  wounds:  clean, dry, intact or healing well  Pt. Ambulating, voiding and taking PO diet without difficulty. Pt pain controlled with PO pain meds.  Labs:  As below  Complications: none  Consults:    Significant Diagnostic Studies: CBC Lab Results  Component Value Date   WBC 12.5 (H) 06/08/2022   HGB 12.0 06/08/2022   HCT 36.7 06/08/2022   MCV 87.0 06/08/2022   PLT 338 06/08/2022    BMET    Component Value Date/Time   NA 139 06/08/2022 0438   K 4.0 06/08/2022 0438   CL 109 06/08/2022 0438   CO2 24 06/08/2022 0438   GLUCOSE 263 (H) 06/08/2022 0438   BUN 12 06/08/2022 0438   CREATININE 0.48 06/08/2022 0438   CALCIUM 8.4 (L) 06/08/2022 0438   GFRNONAA >60 06/08/2022 0438   COAG No results found for: "INR", "PROTIME"   Disposition:  Discharge to :Home  Allergies as of 06/08/2022       Reactions   Keflex [cephalexin] Hives, Itching, Dermatitis   Received Benadryl   Lisinopril Cough   Losartan Other (See Comments)   Blurred vision   Oxycodone-acetaminophen Other (See Comments)   Severe pain while taking         Medication List     TAKE these medications    acetaminophen 500 MG tablet Commonly known as: TYLENOL Take 1,000 mg by mouth every 6 (six) hours as needed (for pain.).   atorvastatin 20 MG tablet Commonly known as: LIPITOR Take 20 mg by mouth daily.   Blood Glucose Monitoring Suppl Kit 1 kit by Does not apply route daily  as needed.   clopidogrel 75 MG tablet Commonly known as: PLAVIX Take 75 mg by mouth daily.   gabapentin 300 MG capsule Commonly known as: Neurontin Take 1 capsule (300 mg total) by mouth 3 (three) times daily as needed for up to 10 days (headache, scalp pain).   ibuprofen 600 MG tablet Commonly known as: ADVIL Take 1 tablet (600 mg total) by mouth every 8 (eight) hours as needed for moderate pain.   metFORMIN 500 MG 24 hr tablet Commonly known as: GLUCOPHAGE-XR Take 2,000 mg by mouth every evening.   Semglee  (yfgn) 100 UNIT/ML Pen Generic drug: insulin glargine-yfgn Inject 40 Units into the skin at bedtime.   telmisartan 40 MG tablet Commonly known as: MICARDIS Take 40 mg by mouth daily.   traMADol 50 MG tablet Commonly known as: Ultram Take 1 tablet (50 mg total) by mouth every 6 (six) hours as needed.       Verbal and written Discharge instructions given to the patient. Wound care per Discharge AVS  Follow-up Information     Dew, Erskine Squibb, MD Follow up.   Specialties: Vascular Surgery, Radiology, Interventional Cardiology Why: See JD/FB in 3-4 weeks with carotid Contact information: [place] [phone]                 Signed: Kris Hartmann, NP  06/08/2022, 9:21 AM

## 2022-06-24 ENCOUNTER — Other Ambulatory Visit (INDEPENDENT_AMBULATORY_CARE_PROVIDER_SITE_OTHER): Payer: Self-pay | Admitting: Vascular Surgery

## 2022-06-24 DIAGNOSIS — Z959 Presence of cardiac and vascular implant and graft, unspecified: Secondary | ICD-10-CM

## 2022-06-24 DIAGNOSIS — I6523 Occlusion and stenosis of bilateral carotid arteries: Secondary | ICD-10-CM

## 2022-06-27 NOTE — Progress Notes (Unsigned)
Patient ID: Lauren Whitney, female   DOB: 1966/01/24, 56 y.o.   MRN: [id number]  No chief complaint on file.   HPI Lauren Whitney is a 56 y.o. female.    Procedure 06/07/2022: Placement of a 9 mm proximal, 7 mm distal 3 cm long Exact stent with the use of the NAV-6 embolic protection device in the right carotid artery. No c/o no TIA symptoms Patient not taking ASA   Past Medical History:  Diagnosis Date   Cholelithiases    Diabetes ([place])    GERD (gastroesophageal reflux disease)    due to gallbladder-no meds   Hypertension    off meds x 2 years-better control now    Past Surgical History:  Procedure Laterality Date   CAROTID PTA/STENT INTERVENTION Right 06/07/2022   Procedure: CAROTID PTA/STENT INTERVENTION;  Surgeon: Algernon Huxley, MD;  Location: [facility];  Service: Cardiovascular;  Laterality: Right;   CESAREAN SECTION     x2   WISDOM TOOTH EXTRACTION        Allergies  Allergen Reactions   Keflex [Cephalexin] Hives, Itching and Dermatitis    Received Benadryl   Lisinopril Cough   Losartan Other (See Comments)    Blurred vision   Oxycodone-Acetaminophen Other (See Comments)    Severe pain while taking     Current Outpatient Medications  Medication Sig Dispense Refill   acetaminophen (TYLENOL) 500 MG tablet Take 1,000 mg by mouth every 6 (six) hours as needed (for pain.).     atorvastatin (LIPITOR) 20 MG tablet Take 20 mg by mouth daily.     Blood Glucose Monitoring Suppl KIT 1 kit by Does not apply route daily as needed. 1 kit 0   clopidogrel (PLAVIX) 75 MG tablet Take 75 mg by mouth daily.     gabapentin (NEURONTIN) 300 MG capsule Take 1 capsule (300 mg total) by mouth 3 (three) times daily as needed for up to 10 days (headache, scalp pain). 30 capsule 0   ibuprofen (ADVIL) 600 MG tablet Take 1 tablet (600 mg total) by mouth every 8 (eight) hours as needed for moderate pain. 60 tablet 1   metFORMIN (GLUCOPHAGE-XR) 500 MG 24 hr tablet Take  2,000 mg by mouth every evening.     SEMGLEE, YFGN, 100 UNIT/ML Pen Inject 40 Units into the skin at bedtime.     telmisartan (MICARDIS) 40 MG tablet Take 40 mg by mouth daily.     traMADol (ULTRAM) 50 MG tablet Take 1 tablet (50 mg total) by mouth every 6 (six) hours as needed. 20 tablet 0   No current facility-administered medications for this visit.        Physical Exam There were no vitals taken for this visit. Gen:  WD/WN, NAD Skin: incision C/D/I, Neuro intact     Assessment/Plan:  1. Carotid stenosis, right Recommend:  The patient is s/p successful right carotid stent  Duplex ultrasound shows 1-39% RICA with widely patent stent.  Continue antiplatelet therapy as prescribed Continue management of CAD, HTN and Hyperlipidemia Healthy heart diet,  encouraged exercise at least 4 times per week  Follow up in 6 months with duplex ultrasound and physical exam based on the patient's carotid stent.    She will add ASA 81 mg po daily   - VAS US CAROTID; Future  2. Bilateral carotid artery stenosis Recommend:  The patient is s/p successful right carotid stent  Duplex ultrasound shows 1-39% RICA with widely patent stent.  Continue antiplatelet  therapy as prescribed Continue management of CAD, HTN and Hyperlipidemia Healthy heart diet,  encouraged exercise at least 4 times per week  Follow up in 6 months with duplex ultrasound and physical exam based on the patient's carotid stent.    She will add ASA 81 mg po daily  - VAS US CAROTID - VAS US CAROTID; Future  3. S/P arterial stent Recommend:  The patient is s/p successful right carotid stent  Duplex ultrasound shows 1-39% RICA with widely patent stent.  Continue antiplatelet therapy as prescribed Continue management of CAD, HTN and Hyperlipidemia Healthy heart diet,  encouraged exercise at least 4 times per week  Follow up in 6 months with duplex ultrasound and physical exam based on the patient's carotid  stent.    She will add ASA 81 mg po daily  - VAS US CAROTID - VAS US CAROTID; Future     Hortencia Pilar 06/27/2022, 10:36 AM   This note was created with Dragon medical transcription system.  Any errors from dictation are unintentional.

## 2022-06-28 ENCOUNTER — Encounter (INDEPENDENT_AMBULATORY_CARE_PROVIDER_SITE_OTHER): Payer: Self-pay | Admitting: Vascular Surgery

## 2022-06-28 ENCOUNTER — Encounter (INDEPENDENT_AMBULATORY_CARE_PROVIDER_SITE_OTHER): Payer: Self-pay

## 2022-06-28 ENCOUNTER — Ambulatory Visit (INDEPENDENT_AMBULATORY_CARE_PROVIDER_SITE_OTHER): Payer: Managed Care, Other (non HMO)

## 2022-06-28 ENCOUNTER — Ambulatory Visit (INDEPENDENT_AMBULATORY_CARE_PROVIDER_SITE_OTHER): Payer: Managed Care, Other (non HMO) | Admitting: Vascular Surgery

## 2022-06-28 VITALS — BP 134/77 | HR 85 | Resp 16 | Wt 181.6 lb

## 2022-06-28 DIAGNOSIS — Z959 Presence of cardiac and vascular implant and graft, unspecified: Secondary | ICD-10-CM

## 2022-06-28 DIAGNOSIS — I6521 Occlusion and stenosis of right carotid artery: Secondary | ICD-10-CM

## 2022-06-28 DIAGNOSIS — I6523 Occlusion and stenosis of bilateral carotid arteries: Secondary | ICD-10-CM

## 2022-08-21 ENCOUNTER — Other Ambulatory Visit (INDEPENDENT_AMBULATORY_CARE_PROVIDER_SITE_OTHER): Payer: Self-pay | Admitting: Vascular Surgery

## 2022-12-28 ENCOUNTER — Ambulatory Visit (INDEPENDENT_AMBULATORY_CARE_PROVIDER_SITE_OTHER): Payer: Managed Care, Other (non HMO)

## 2022-12-28 ENCOUNTER — Ambulatory Visit (INDEPENDENT_AMBULATORY_CARE_PROVIDER_SITE_OTHER): Payer: Managed Care, Other (non HMO) | Admitting: Vascular Surgery

## 2022-12-28 VITALS — BP 144/77 | HR 86 | Resp 18 | Ht 65.0 in | Wt 191.2 lb

## 2022-12-28 DIAGNOSIS — E785 Hyperlipidemia, unspecified: Secondary | ICD-10-CM | POA: Diagnosis not present

## 2022-12-28 DIAGNOSIS — I10 Essential (primary) hypertension: Secondary | ICD-10-CM | POA: Diagnosis not present

## 2022-12-28 DIAGNOSIS — I6521 Occlusion and stenosis of right carotid artery: Secondary | ICD-10-CM

## 2022-12-28 DIAGNOSIS — E119 Type 2 diabetes mellitus without complications: Secondary | ICD-10-CM

## 2022-12-28 MED ORDER — DOXYCYCLINE HYCLATE 100 MG PO CAPS: 100.0000 mg | ORAL_CAPSULE | Freq: Two times a day (BID) | ORAL | 0 refills | Status: DC

## 2022-12-28 NOTE — Progress Notes (Signed)
MRN : [id number]  Lauren Whitney is a 57 y.o. ([birth year]) female who presents with chief complaint of  Chief Complaint  Patient presents with   Follow-up  .  History of Present Illness: Patient returns today in follow up of her carotid disease.  She is doing well without any focal neurologic symptoms.  She has about 6 to 7 months status post right carotid stent placement for high-grade stenosis.  Duplex today shows the carotid stent to be widely patent on the right and there is 1 to 39% left ICA stenosis. She has been having trouble with recurrent hidradenitis and asks if we can provide her a prescription for some antibiotics today.  Current Outpatient Medications  Medication Sig Dispense Refill   acetaminophen (TYLENOL) 500 MG tablet Take 1,000 mg by mouth every 6 (six) hours as needed (for pain.).     atorvastatin (LIPITOR) 20 MG tablet Take 20 mg by mouth daily.     Blood Glucose Monitoring Suppl KIT 1 kit by Does not apply route daily as needed. 1 kit 0   Continuous Blood Gluc Sensor (FREESTYLE LIBRE 2 SENSOR) MISC USE 1 KIT EVERY 14 (FOURTEEN) DAYS FOR GLUCOSE MONITORING     doxycycline (VIBRAMYCIN) 100 MG capsule Take 1 capsule (100 mg total) by mouth 2 (two) times daily. 28 capsule 0   ibuprofen (ADVIL) 600 MG tablet Take 1 tablet (600 mg total) by mouth every 8 (eight) hours as needed for moderate pain. 60 tablet 1   metFORMIN (GLUCOPHAGE-XR) 500 MG 24 hr tablet Take 2,000 mg by mouth every evening.     SEMGLEE, YFGN, 100 UNIT/ML Pen Inject 40 Units into the skin at bedtime.     telmisartan (MICARDIS) 40 MG tablet Take 40 mg by mouth daily.     clopidogrel (PLAVIX) 75 MG tablet TAKE 1 TABLET BY MOUTH EVERY DAY (Patient not taking: Reported on 12/28/2022) 90 tablet 1   gabapentin (NEURONTIN) 300 MG capsule Take 1 capsule (300 mg total) by mouth 3 (three) times daily as needed for up to 10 days (headache, scalp pain). 30 capsule 0   traMADol (ULTRAM) 50 MG tablet Take 1 tablet  (50 mg total) by mouth every 6 (six) hours as needed. (Patient not taking: Reported on 12/28/2022) 20 tablet 0   No current facility-administered medications for this visit.    Past Medical History:  Diagnosis Date   Cholelithiases    Diabetes (HCC)    GERD (gastroesophageal reflux disease)    due to gallbladder-no meds   Hypertension    off meds x 2 years-better control now    Past Surgical History:  Procedure Laterality Date   CAROTID PTA/STENT INTERVENTION Right 06/07/2022   Procedure: CAROTID PTA/STENT INTERVENTION;  Surgeon: Annice Needy, MD;  Location: [facility];  Service: Cardiovascular;  Laterality: Right;   CESAREAN SECTION     x2   WISDOM TOOTH EXTRACTION       Social History   Tobacco Use   Smoking status: Every Day    Packs/day: 1.00    Years: 30.00    Additional pack years: 0.00    Total pack years: 30.00    Types: Cigarettes   Smokeless tobacco: Never  Vaping Use   Vaping Use: Never used  Substance Use Topics   Alcohol use: Never   Drug use: Never     Family History  Family history unknown: Yes     Allergies  Allergen Reactions   Keflex [Cephalexin] Hives,  Itching and Dermatitis    Received Benadryl   Lisinopril Cough   Losartan Other (See Comments)    Blurred vision   Oxycodone-Acetaminophen Other (See Comments)    Severe pain while taking      REVIEW OF SYSTEMS (Negative unless checked)  Constitutional: [] Weight loss  [] Fever  [] Chills Cardiac: [] Chest pain   [] Chest pressure   [] Palpitations   [] Shortness of breath when laying flat   [] Shortness of breath at rest   [] Shortness of breath with exertion. Vascular:  [] Pain in legs with walking   [] Pain in legs at rest   [] Pain in legs when laying flat   [] Claudication   [] Pain in feet when walking  [] Pain in feet at rest  [] Pain in feet when laying flat   [] History of DVT   [] Phlebitis   [] Swelling in legs   [] Varicose veins   [] Non-healing ulcers Pulmonary:   [] Uses home oxygen    [] Productive cough   [] Hemoptysis   [] Wheeze  [] COPD   [] Asthma Neurologic:  [] Dizziness  [] Blackouts   [] Seizures   [] History of stroke   [] History of TIA  [] Aphasia   [] Temporary blindness   [] Dysphagia   [] Weakness or numbness in arms   [] Weakness or numbness in legs Musculoskeletal:  [] Arthritis   [] Joint swelling   [] Joint pain   [] Low back pain Hematologic:  [] Easy bruising  [] Easy bleeding   [] Hypercoagulable state   [] Anemic   Gastrointestinal:  [] Blood in stool   [] Vomiting blood  [x] Gastroesophageal reflux/heartburn   [x] Abdominal pain Genitourinary:  [] Chronic kidney disease   [] Difficult urination  [] Frequent urination  [] Burning with urination   [] Hematuria Skin:  [] Rashes   [] Ulcers   [] Wounds Psychological:  [] History of anxiety   []  History of major depression.  Physical Examination  BP (!) 144/77 (BP Location: Left Arm)   Pulse 86   Resp 18   Ht 5\' 5"  (1.651 m)   Wt 191 lb 3.2 oz (86.7 kg)   BMI 31.82 kg/m  Gen:  WD/WN, NAD Head: [place]/AT, No temporalis wasting. Ear/Nose/Throat: Hearing grossly intact, nares w/o erythema or drainage Eyes: Conjunctiva clear. Sclera non-icteric Neck: Supple.  Trachea midline. No bruit.  Pulmonary:  Good air movement, no use of accessory muscles.  Cardiac: RRR, no JVD Vascular:  Vessel Right Left  Radial Palpable Palpable               Musculoskeletal: M/S 5/5 throughout.  No deformity or atrophy. No edema. Neurologic: Sensation grossly intact in extremities.  Symmetrical.  Speech is fluent.  Psychiatric: Judgment intact, Mood & affect appropriate for pt's clinical situation. Dermatologic: No rashes or ulcers noted.  No cellulitis or open wounds.     Labs No results found for this or any previous visit (from the past 2160 hour(s)).  Radiology No results found.  Assessment/Plan Essential hypertension, benign blood pressure control important in reducing the progression of atherosclerotic disease. On appropriate oral  medications.     Diabetes (HCC) blood glucose control important in reducing the progression of atherosclerotic disease. Also, involved in wound healing. On appropriate medications.     Hyperlipidemia lipid control important in reducing the progression of atherosclerotic disease. Continue statin therapy  Carotid stenosis Duplex today shows the carotid stent to be widely patent on the right and there is 1 to 39% left ICA stenosis.  Doing well.  We discussed that if she would like, she can stop the Plavix at this point and continue just aspirin and a statin  agent.  Recheck in 6 months with carotid duplex.    Festus Barren, MD  12/28/2022 9:14 AM    This note was created with Dragon medical transcription system.  Any errors from dictation are purely unintentional

## 2022-12-28 NOTE — Assessment & Plan Note (Signed)
Duplex today shows the carotid stent to be widely patent on the right and there is 1 to 39% left ICA stenosis.  Doing well.  We discussed that if she would like, she can stop the Plavix at this point and continue just aspirin and a statin agent.  Recheck in 6 months with carotid duplex.

## 2023-02-09 ENCOUNTER — Emergency Department: Payer: Managed Care, Other (non HMO)

## 2023-02-09 ENCOUNTER — Encounter
Admission: EM | Disposition: A | Discharge status: Home / Self Care | Payer: Self-pay | Source: Home / Self Care | Attending: Internal Medicine

## 2023-02-09 ENCOUNTER — Inpatient Hospital Stay
Admit: 2023-02-09 | Discharge: 2023-02-09 | Disposition: A | Discharge status: Home / Self Care | Payer: Managed Care, Other (non HMO) | Attending: Internal Medicine | Admitting: Internal Medicine

## 2023-02-09 ENCOUNTER — Inpatient Hospital Stay
Admission: EM | Admit: 2023-02-09 | Discharge: 2023-02-11 | DRG: 322 | Disposition: A | Discharge status: Home / Self Care | Payer: Managed Care, Other (non HMO) | Attending: Internal Medicine | Admitting: Internal Medicine

## 2023-02-09 ENCOUNTER — Other Ambulatory Visit: Payer: Self-pay

## 2023-02-09 DIAGNOSIS — Z6831 Body mass index (BMI) 31.0-31.9, adult: Secondary | ICD-10-CM

## 2023-02-09 DIAGNOSIS — I2129 ST elevation (STEMI) myocardial infarction involving other sites: Principal | ICD-10-CM | POA: Diagnosis present

## 2023-02-09 DIAGNOSIS — Z79899 Other long term (current) drug therapy: Secondary | ICD-10-CM

## 2023-02-09 DIAGNOSIS — Z720 Tobacco use: Secondary | ICD-10-CM | POA: Diagnosis not present

## 2023-02-09 DIAGNOSIS — D72829 Elevated white blood cell count, unspecified: Secondary | ICD-10-CM | POA: Diagnosis not present

## 2023-02-09 DIAGNOSIS — E1165 Type 2 diabetes mellitus with hyperglycemia: Secondary | ICD-10-CM

## 2023-02-09 DIAGNOSIS — Z7984 Long term (current) use of oral hypoglycemic drugs: Secondary | ICD-10-CM

## 2023-02-09 DIAGNOSIS — Z794 Long term (current) use of insulin: Secondary | ICD-10-CM

## 2023-02-09 DIAGNOSIS — Z888 Allergy status to other drugs, medicaments and biological substances status: Secondary | ICD-10-CM

## 2023-02-09 DIAGNOSIS — I10 Essential (primary) hypertension: Secondary | ICD-10-CM | POA: Diagnosis present

## 2023-02-09 DIAGNOSIS — Z825 Family history of asthma and other chronic lower respiratory diseases: Secondary | ICD-10-CM

## 2023-02-09 DIAGNOSIS — K219 Gastro-esophageal reflux disease without esophagitis: Secondary | ICD-10-CM | POA: Diagnosis present

## 2023-02-09 DIAGNOSIS — F17210 Nicotine dependence, cigarettes, uncomplicated: Secondary | ICD-10-CM | POA: Diagnosis present

## 2023-02-09 DIAGNOSIS — E119 Type 2 diabetes mellitus without complications: Secondary | ICD-10-CM

## 2023-02-09 DIAGNOSIS — Z7902 Long term (current) use of antithrombotics/antiplatelets: Secondary | ICD-10-CM | POA: Diagnosis not present

## 2023-02-09 DIAGNOSIS — I213 ST elevation (STEMI) myocardial infarction of unspecified site: Secondary | ICD-10-CM | POA: Diagnosis present

## 2023-02-09 DIAGNOSIS — I2510 Atherosclerotic heart disease of native coronary artery without angina pectoris: Secondary | ICD-10-CM | POA: Diagnosis present

## 2023-02-09 DIAGNOSIS — E785 Hyperlipidemia, unspecified: Secondary | ICD-10-CM | POA: Diagnosis present

## 2023-02-09 DIAGNOSIS — E669 Obesity, unspecified: Secondary | ICD-10-CM | POA: Insufficient documentation

## 2023-02-09 DIAGNOSIS — Z885 Allergy status to narcotic agent status: Secondary | ICD-10-CM | POA: Diagnosis not present

## 2023-02-09 HISTORY — PX: LEFT HEART CATH AND CORONARY ANGIOGRAPHY: CATH118249

## 2023-02-09 HISTORY — PX: CORONARY/GRAFT ACUTE MI REVASCULARIZATION: CATH118305

## 2023-02-09 LAB — CBC WITH DIFFERENTIAL/PLATELET
Abs Immature Granulocytes: 0.03 10*3/uL (ref 0.00–0.07)
Basophils Absolute: 0.1 10*3/uL (ref 0.0–0.1)
Basophils Relative: 1 %
Eosinophils Absolute: 0.6 10*3/uL — ABNORMAL HIGH (ref 0.0–0.5)
Eosinophils Relative: 5 %
HCT: 44.7 % (ref 36.0–46.0)
Hemoglobin: 14.4 g/dL (ref 12.0–15.0)
Immature Granulocytes: 0 %
Lymphocytes Relative: 44 %
Lymphs Abs: 6 10*3/uL — ABNORMAL HIGH (ref 0.7–4.0)
MCH: 27.9 pg (ref 26.0–34.0)
MCHC: 32.2 g/dL (ref 30.0–36.0)
MCV: 86.5 fL (ref 80.0–100.0)
Monocytes Absolute: 0.8 10*3/uL (ref 0.1–1.0)
Monocytes Relative: 6 %
Neutro Abs: 6.2 10*3/uL (ref 1.7–7.7)
Neutrophils Relative %: 44 %
Platelets: 366 10*3/uL (ref 150–400)
RBC: 5.17 MIL/uL — ABNORMAL HIGH (ref 3.87–5.11)
RDW: 13.2 % (ref 11.5–15.5)
WBC: 13.6 10*3/uL — ABNORMAL HIGH (ref 4.0–10.5)
nRBC: 0 % (ref 0.0–0.2)

## 2023-02-09 LAB — COMPREHENSIVE METABOLIC PANEL
ALT: 19 U/L (ref 0–44)
AST: 21 U/L (ref 15–41)
Albumin: 3.6 g/dL (ref 3.5–5.0)
Alkaline Phosphatase: 85 U/L (ref 38–126)
Anion gap: 13 (ref 5–15)
BUN: 10 mg/dL (ref 6–20)
CO2: 21 mmol/L — ABNORMAL LOW (ref 22–32)
Calcium: 8.6 mg/dL — ABNORMAL LOW (ref 8.9–10.3)
Chloride: 101 mmol/L (ref 98–111)
Creatinine, Ser: 0.73 mg/dL (ref 0.44–1.00)
GFR, Estimated: >60 mL/min (ref >60)
Glucose, Bld: 324 mg/dL — ABNORMAL HIGH (ref 70–99)
Potassium: 3.7 mmol/L (ref 3.5–5.1)
Sodium: 135 mmol/L (ref 135–145)
Total Bilirubin: 0.7 mg/dL (ref 0.3–1.2)
Total Protein: 6.6 g/dL (ref 6.5–8.1)

## 2023-02-09 LAB — MRSA NEXT GEN BY PCR, NASAL: MRSA by PCR Next Gen: NOT DETECTED

## 2023-02-09 LAB — TROPONIN I (HIGH SENSITIVITY)
Troponin I (High Sensitivity): 16 ng/L (ref <18)
Troponin I (High Sensitivity): 10131 ng/L (ref <18)
Troponin I (High Sensitivity): >24000 ng/L (ref <18)

## 2023-02-09 LAB — POCT ACTIVATED CLOTTING TIME
Activated Clotting Time: 293 seconds
Activated Clotting Time: 299 seconds
Activated Clotting Time: 360 seconds
Activated Clotting Time: 281 seconds
Activated Clotting Time: 305 seconds

## 2023-02-09 LAB — PROTIME-INR
INR: 1 (ref 0.8–1.2)
Prothrombin Time: 13.1 seconds (ref 11.4–15.2)

## 2023-02-09 LAB — LIPID PANEL
Cholesterol: 192 mg/dL (ref 0–200)
HDL: 34 mg/dL — ABNORMAL LOW (ref >40)
LDL Cholesterol: 121 mg/dL — ABNORMAL HIGH (ref 0–99)
Total CHOL/HDL Ratio: 5.6 RATIO
Triglycerides: 187 mg/dL — ABNORMAL HIGH (ref <150)
VLDL: 37 mg/dL (ref 0–40)

## 2023-02-09 LAB — HEMOGLOBIN A1C
Hgb A1c MFr Bld: 12.6 % — ABNORMAL HIGH (ref 4.8–5.6)
Mean Plasma Glucose: 314.92 mg/dL

## 2023-02-09 LAB — APTT: aPTT: 29 seconds (ref 24–36)

## 2023-02-09 LAB — GLUCOSE, CAPILLARY
Glucose-Capillary: 290 mg/dL — ABNORMAL HIGH (ref 70–99)
Glucose-Capillary: 239 mg/dL — ABNORMAL HIGH (ref 70–99)

## 2023-02-09 SURGERY — CORONARY/GRAFT ACUTE MI REVASCULARIZATION
Anesthesia: Moderate Sedation

## 2023-02-09 MED ORDER — NITROGLYCERIN 0.4 MG SL SUBL: 0.4000 mg | SUBLINGUAL_TABLET | SUBLINGUAL | Status: DC | PRN

## 2023-02-09 MED ORDER — INSULIN ASPART 100 UNIT/ML IJ SOLN: 0.0000 [IU] | Freq: Three times a day (TID) | INTRAMUSCULAR | Status: DC

## 2023-02-09 MED ORDER — MIDAZOLAM HCL 2 MG/2ML IJ SOLN
INTRAMUSCULAR | Status: DC | PRN
  Administered 2023-02-09 (×2): .5 mg via INTRAVENOUS
  Administered 2023-02-09: 1 mg via INTRAVENOUS

## 2023-02-09 MED ORDER — SODIUM CHLORIDE 0.9 % IV BOLUS
250.0000 mL | Freq: Once | INTRAVENOUS | Status: AC
  Administered 2023-02-09: 250 mL via INTRAVENOUS

## 2023-02-09 MED ORDER — INSULIN DETEMIR 100 UNIT/ML ~~LOC~~ SOLN
20.0000 [IU] | Freq: Every day | SUBCUTANEOUS | Status: DC
  Administered 2023-02-09 – 2023-02-10 (×2): 20 [IU] via SUBCUTANEOUS
  Filled 2023-02-09 (×2): qty 0.2

## 2023-02-09 MED ORDER — SODIUM CHLORIDE 0.9 % IV BOLUS
INTRAVENOUS | Status: AC | PRN
  Administered 2023-02-09: 500 mL via INTRAVENOUS

## 2023-02-09 MED ORDER — ONDANSETRON HCL 4 MG/2ML IJ SOLN: 4.0000 mg | Freq: Four times a day (QID) | INTRAMUSCULAR | Status: DC | PRN

## 2023-02-09 MED ORDER — MIDAZOLAM HCL 2 MG/2ML IJ SOLN
INTRAMUSCULAR | Status: AC
  Filled 2023-02-09: qty 2

## 2023-02-09 MED ORDER — DICYCLOMINE HCL 10 MG PO CAPS
10.0000 mg | ORAL_CAPSULE | Freq: Three times a day (TID) | ORAL | Status: DC | PRN
  Administered 2023-02-10: 10 mg via ORAL
  Filled 2023-02-09 (×2): qty 1

## 2023-02-09 MED ORDER — ONDANSETRON HCL 4 MG/2ML IJ SOLN
4.0000 mg | Freq: Four times a day (QID) | INTRAMUSCULAR | Status: DC | PRN
  Administered 2023-02-10 (×2): 4 mg via INTRAVENOUS
  Filled 2023-02-09 (×2): qty 2

## 2023-02-09 MED ORDER — NICOTINE 14 MG/24HR TD PT24
14.0000 mg | MEDICATED_PATCH | Freq: Every day | TRANSDERMAL | Status: DC
  Administered 2023-02-09 – 2023-02-10 (×2): 14 mg via TRANSDERMAL
  Filled 2023-02-09 (×2): qty 1

## 2023-02-09 MED ORDER — ACETAMINOPHEN 325 MG PO TABS: 650.0000 mg | ORAL_TABLET | ORAL | Status: DC | PRN

## 2023-02-09 MED ORDER — IRBESARTAN 150 MG PO TABS: 150.0000 mg | ORAL_TABLET | Freq: Every day | ORAL | Status: DC

## 2023-02-09 MED ORDER — VERAPAMIL HCL 2.5 MG/ML IV SOLN
INTRAVENOUS | Status: AC
  Filled 2023-02-09: qty 2

## 2023-02-09 MED ORDER — ATORVASTATIN CALCIUM 80 MG PO TABS
80.0000 mg | ORAL_TABLET | Freq: Every day | ORAL | Status: DC
  Administered 2023-02-09 – 2023-02-10 (×2): 80 mg via ORAL
  Filled 2023-02-09: qty 1
  Filled 2023-02-09: qty 4
  Filled 2023-02-09 (×2): qty 1

## 2023-02-09 MED ORDER — NITROGLYCERIN 1 MG/10 ML FOR IR/CATH LAB
INTRA_ARTERIAL | Status: DC | PRN
  Administered 2023-02-09 (×3): 200 ug via INTRACORONARY
  Administered 2023-02-09: 150 ug via INTRACORONARY
  Administered 2023-02-09: 200 ug via INTRACORONARY

## 2023-02-09 MED ORDER — HEPARIN (PORCINE) IN NACL 1000-0.9 UT/500ML-% IV SOLN
INTRAVENOUS | Status: DC | PRN
  Administered 2023-02-09: 1000 mL

## 2023-02-09 MED ORDER — ASPIRIN 81 MG PO CHEW
81.0000 mg | CHEWABLE_TABLET | Freq: Every day | ORAL | Status: DC
  Administered 2023-02-10: 81 mg via ORAL
  Filled 2023-02-09: qty 1

## 2023-02-09 MED ORDER — ORAL CARE MOUTH RINSE: 15.0000 mL | OROMUCOSAL | Status: DC | PRN

## 2023-02-09 MED ORDER — INSULIN ASPART 100 UNIT/ML IJ SOLN
0.0000 [IU] | Freq: Three times a day (TID) | INTRAMUSCULAR | Status: DC
  Administered 2023-02-09: 3 [IU] via SUBCUTANEOUS
  Administered 2023-02-10: 5 [IU] via SUBCUTANEOUS
  Administered 2023-02-10: 7 [IU] via SUBCUTANEOUS
  Administered 2023-02-10: 5 [IU] via SUBCUTANEOUS
  Filled 2023-02-09 (×4): qty 1

## 2023-02-09 MED ORDER — HEPARIN SODIUM (PORCINE) 1000 UNIT/ML IJ SOLN
INTRAMUSCULAR | Status: AC
  Filled 2023-02-09: qty 10

## 2023-02-09 MED ORDER — SODIUM CHLORIDE 0.9 % IV SOLN: INTRAVENOUS | Status: DC

## 2023-02-09 MED ORDER — SODIUM CHLORIDE 0.9% FLUSH
3.0000 mL | Freq: Two times a day (BID) | INTRAVENOUS | Status: DC
  Administered 2023-02-10 (×2): 3 mL via INTRAVENOUS

## 2023-02-09 MED ORDER — LABETALOL HCL 5 MG/ML IV SOLN: 10.0000 mg | INTRAVENOUS | Status: AC | PRN

## 2023-02-09 MED ORDER — HEPARIN (PORCINE) IN NACL 1000-0.9 UT/500ML-% IV SOLN
INTRAVENOUS | Status: AC
  Filled 2023-02-09: qty 1000

## 2023-02-09 MED ORDER — SODIUM CHLORIDE 0.9 % IV BOLUS
1000.0000 mL | Freq: Once | INTRAVENOUS | Status: AC
  Administered 2023-02-09: 1000 mL via INTRAVENOUS

## 2023-02-09 MED ORDER — SODIUM CHLORIDE 0.9 % IV SOLN: 250.0000 mL | INTRAVENOUS | Status: DC | PRN

## 2023-02-09 MED ORDER — HEPARIN SODIUM (PORCINE) 1000 UNIT/ML IJ SOLN
INTRAMUSCULAR | Status: DC | PRN
  Administered 2023-02-09: 5000 [IU] via INTRAVENOUS
  Administered 2023-02-09: 6000 [IU] via INTRAVENOUS
  Administered 2023-02-09 (×2): 4000 [IU] via INTRAVENOUS

## 2023-02-09 MED ORDER — HEPARIN SODIUM (PORCINE) 5000 UNIT/ML IJ SOLN: 4000.0000 [IU] | Freq: Once | INTRAMUSCULAR | Status: DC

## 2023-02-09 MED ORDER — ASPIRIN 81 MG PO CHEW: 324.0000 mg | CHEWABLE_TABLET | Freq: Once | ORAL | Status: DC

## 2023-02-09 MED ORDER — TICAGRELOR 90 MG PO TABS
ORAL_TABLET | ORAL | Status: AC
  Filled 2023-02-09: qty 2

## 2023-02-09 MED ORDER — IOHEXOL 300 MG/ML  SOLN
INTRAMUSCULAR | Status: DC | PRN
  Administered 2023-02-09: 440 mL

## 2023-02-09 MED ORDER — TICAGRELOR 90 MG PO TABS
ORAL_TABLET | ORAL | Status: DC | PRN
  Administered 2023-02-09: 180 mg via ORAL

## 2023-02-09 MED ORDER — SODIUM CHLORIDE 0.9 % WEIGHT BASED INFUSION
1.0000 mL/kg/h | INTRAVENOUS | Status: DC
  Administered 2023-02-09 – 2023-02-10 (×3): 1 mL/kg/h via INTRAVENOUS

## 2023-02-09 MED ORDER — FENTANYL CITRATE (PF) 100 MCG/2ML IJ SOLN
INTRAMUSCULAR | Status: DC | PRN
  Administered 2023-02-09 (×3): 25 ug via INTRAVENOUS

## 2023-02-09 MED ORDER — VERAPAMIL HCL 2.5 MG/ML IV SOLN
INTRAVENOUS | Status: DC | PRN
  Administered 2023-02-09 (×2): 2.5 mg via INTRA_ARTERIAL

## 2023-02-09 MED ORDER — CHLORHEXIDINE GLUCONATE CLOTH 2 % EX PADS
6.0000 | MEDICATED_PAD | Freq: Every day | CUTANEOUS | Status: DC
  Administered 2023-02-09 – 2023-02-10 (×2): 6 via TOPICAL

## 2023-02-09 MED ORDER — HYDRALAZINE HCL 20 MG/ML IJ SOLN: 10.0000 mg | INTRAMUSCULAR | Status: AC | PRN

## 2023-02-09 MED ORDER — SODIUM CHLORIDE 0.9% FLUSH: 3.0000 mL | INTRAVENOUS | Status: DC | PRN

## 2023-02-09 MED ORDER — NITROGLYCERIN 1 MG/10 ML FOR IR/CATH LAB
INTRA_ARTERIAL | Status: AC
  Filled 2023-02-09: qty 10

## 2023-02-09 MED ORDER — FENTANYL CITRATE (PF) 100 MCG/2ML IJ SOLN
INTRAMUSCULAR | Status: AC
  Filled 2023-02-09: qty 2

## 2023-02-09 MED ORDER — TICAGRELOR 90 MG PO TABS
90.0000 mg | ORAL_TABLET | Freq: Two times a day (BID) | ORAL | Status: DC
  Administered 2023-02-09 – 2023-02-10 (×3): 90 mg via ORAL
  Filled 2023-02-09 (×3): qty 1

## 2023-02-09 MED ORDER — LIDOCAINE HCL (PF) 1 % IJ SOLN
INTRAMUSCULAR | Status: DC | PRN
  Administered 2023-02-09: 2 mL

## 2023-02-09 SURGICAL SUPPLY — 29 items
BALLN MINITREK RX 2.0X12 (BALLOONS) ×1
BALLN MINITREK RX 2.0X15 (BALLOONS) ×1
BALLN TREK RX 2.5X15 (BALLOONS) ×1
BALLN ~~LOC~~ TREK NEO RX 2.5X15 (BALLOONS) IMPLANT
BALLN ~~LOC~~ TREK NEO RX 2.75X15 (BALLOONS) IMPLANT
BALLOON MINITREK RX 2.0X12 (BALLOONS) IMPLANT
BALLOON MINITREK RX 2.0X15 (BALLOONS) IMPLANT
BALLOON TREK RX 2.5X15 (BALLOONS) IMPLANT
CATH INFINITI 5 FR JL3.5 (CATHETERS) IMPLANT
CATH INFINITI 5FR ANG PIGTAIL (CATHETERS) IMPLANT
CATH INFINITI JR4 5F (CATHETERS) IMPLANT
CATH LAUNCHER 6FR EBU 3 (CATHETERS) IMPLANT
CATH VISTA GUIDE 6FR JL3.5 SH (CATHETERS) IMPLANT
DEVICE RAD TR BAND REGULAR (VASCULAR PRODUCTS) IMPLANT
DRAPE BRACHIAL (DRAPES) IMPLANT
GLIDESHEATH SLEND SS 6F .021 (SHEATH) IMPLANT
GUIDEWIRE INQWIRE 1.5J.035X260 (WIRE) IMPLANT
INQWIRE 1.5J .035X260CM (WIRE) ×1
KIT ENCORE 26 ADVANTAGE (KITS) IMPLANT
KIT SYRINGE INJ CVI SPIKEX1 (MISCELLANEOUS) IMPLANT
PACK CARDIAC CATH (CUSTOM PROCEDURE TRAY) ×1 IMPLANT
PROTECTION STATION PRESSURIZED (MISCELLANEOUS) ×1
SET ATX-X65L (MISCELLANEOUS) IMPLANT
STATION PROTECTION PRESSURIZED (MISCELLANEOUS) IMPLANT
STENT ONYX FRONTIER 2.5X22 (Permanent Stent) IMPLANT
STENT ONYX FRONTIER 2.75X18 (Permanent Stent) IMPLANT
TUBING CIL FLEX 10 FLL-RA (TUBING) IMPLANT
WIRE ASAHI PROWATER 180CM (WIRE) IMPLANT
WIRE RUNTHROUGH .014X180CM (WIRE) IMPLANT

## 2023-02-09 NOTE — ED Triage Notes (Addendum)
Pt brought to ED via [facility] from Home. Pt complaining of CP while at work around 10am. Pt states she went home pain did not get better so she called EMS. Pt states pain is mid chest. Dr. Cassie Freer at bedside on pt's arrival.  Pt vitals 95/46 45 HR Afib  EMS gave 324 Aspirin

## 2023-02-09 NOTE — Consult Note (Signed)
[facility] Cardiology  CARDIOLOGY CONSULT NOTE  Patient ID: Lauren Whitney MRN: [id number] DOB/AGE: 01-Jul-1966 57 y.o.  Admit date: 02/09/2023 Referring Physician Roxan Hockey Primary Physician [facility] Primary Cardiologist  Reason for Consultation posterior ST elevation myocardial infarction  HPI: 57 year old female referred for code STEMI.  Patient has a history of insulin requiring diabetes and tobacco abuse.  She was in usual state of health until 10 AM today when she experienced 5 out of 10 substernal chest pain.  Was brought to [facility] ED where ECG revealed ST elevations in leads III and aVF.  She was urgently brought to the cardiac catheterization laboratory where coronary angiography revealed occluded mid left circumflex.  The patient underwent primary PCI receiving overlapping eluding stents in the mid and distal left circumflex, and underwent PTCA of the ostium of OM1 with an excellent angiographic result.  Ventriculography revealed mildly reduced left ventricular function with posterior wall hypokinesis.  Review of systems complete and found to be negative unless listed above     Past Medical History:  Diagnosis Date   Cholelithiases    Diabetes (HCC)    GERD (gastroesophageal reflux disease)    due to gallbladder-no meds   Hypertension    off meds x 2 years-better control now    Past Surgical History:  Procedure Laterality Date   CAROTID PTA/STENT INTERVENTION Right 06/07/2022   Procedure: CAROTID PTA/STENT INTERVENTION;  Surgeon: Annice Needy, MD;  Location: [facility];  Service: Cardiovascular;  Laterality: Right;   CESAREAN SECTION     x2   WISDOM TOOTH EXTRACTION      Medications Prior to Admission  Medication Sig Dispense Refill Last Dose   acetaminophen (TYLENOL) 500 MG tablet Take 1,000 mg by mouth every 6 (six) hours as needed (for pain.).      atorvastatin (LIPITOR) 20 MG tablet Take 20 mg by mouth daily.      Blood Glucose Monitoring Suppl KIT 1 kit by Does not  apply route daily as needed. 1 kit 0    clopidogrel (PLAVIX) 75 MG tablet TAKE 1 TABLET BY MOUTH EVERY DAY (Patient not taking: Reported on 12/28/2022) 90 tablet 1    Continuous Blood Gluc Sensor (FREESTYLE LIBRE 2 SENSOR) MISC USE 1 KIT EVERY 14 (FOURTEEN) DAYS FOR GLUCOSE MONITORING      doxycycline (VIBRAMYCIN) 100 MG capsule Take 1 capsule (100 mg total) by mouth 2 (two) times daily. 28 capsule 0    gabapentin (NEURONTIN) 300 MG capsule Take 1 capsule (300 mg total) by mouth 3 (three) times daily as needed for up to 10 days (headache, scalp pain). 30 capsule 0    ibuprofen (ADVIL) 600 MG tablet Take 1 tablet (600 mg total) by mouth every 8 (eight) hours as needed for moderate pain. 60 tablet 1    metFORMIN (GLUCOPHAGE-XR) 500 MG 24 hr tablet Take 2,000 mg by mouth every evening.      SEMGLEE, YFGN, 100 UNIT/ML Pen Inject 40 Units into the skin at bedtime.      telmisartan (MICARDIS) 40 MG tablet Take 40 mg by mouth daily.      traMADol (ULTRAM) 50 MG tablet Take 1 tablet (50 mg total) by mouth every 6 (six) hours as needed. (Patient not taking: Reported on 12/28/2022) 20 tablet 0    Social History   Socioeconomic History   Marital status: Married    Spouse name: Not on file   Number of children: Not on file   Years of education: Not on file  Highest education level: Not on file  Occupational History   Not on file  Tobacco Use   Smoking status: Every Day    Packs/day: 1.00    Years: 30.00    Additional pack years: 0.00    Total pack years: 30.00    Types: Cigarettes   Smokeless tobacco: Never  Vaping Use   Vaping Use: Never used  Substance and Sexual Activity   Alcohol use: Never   Drug use: Never   Sexual activity: Yes  Other Topics Concern   Not on file  Social History Narrative   Lives with husband and son.  4 pets; cats.    Social Determinants of Health   Financial Resource Strain: Not on file  Food Insecurity: No Food Insecurity (06/07/2022)   Hunger Vital Sign     Worried About Running Out of Food in the Last Year: Never true    Ran Out of Food in the Last Year: Never true  Transportation Needs: No Transportation Needs (06/07/2022)   PRAPARE - Administrator, Civil Service (Medical): No    Lack of Transportation (Non-Medical): No  Physical Activity: Not on file  Stress: Not on file  Social Connections: Not on file  Intimate Partner Violence: Not At Risk (06/07/2022)   Humiliation, Afraid, Rape, and Kick questionnaire    Fear of Current or Ex-Partner: No    Emotionally Abused: No    Physically Abused: No    Sexually Abused: No    Family History  Family history unknown: Yes      Review of systems complete and found to be negative unless listed above      PHYSICAL EXAM  General: Well developed, well nourished, in no acute distress HEENT:  Normocephalic and atramatic Neck:  No JVD.  Lungs: Clear bilaterally to auscultation and percussion. Heart: HRRR . Normal S1 and S2 without gallops or murmurs.  Abdomen: Bowel sounds are positive, abdomen soft and non-tender  Msk:  Back normal, normal gait. Normal strength and tone for age. Extremities: No clubbing, cyanosis or edema.   Neuro: Alert and oriented X 3. Psych:  Good affect, responds appropriately  Labs:   Lab Results  Component Value Date   WBC 13.6 (H) 02/09/2023   HGB 14.4 02/09/2023   HCT 44.7 02/09/2023   MCV 86.5 02/09/2023   PLT 366 02/09/2023    Recent Labs  Lab 02/09/23 1259  NA 135  K 3.7  CL 101  CO2 21*  BUN 10  CREATININE 0.73  CALCIUM 8.6*  PROT 6.6  BILITOT 0.7  ALKPHOS 85  ALT 19  AST 21  GLUCOSE 324*   No results found for: "CKTOTAL", "CKMB", "CKMBINDEX", "TROPONINI"  Lab Results  Component Value Date   CHOL 192 02/09/2023   Lab Results  Component Value Date   HDL 34 (L) 02/09/2023   Lab Results  Component Value Date   LDLCALC 121 (H) 02/09/2023   Lab Results  Component Value Date   TRIG 187 (H) 02/09/2023   Lab Results   Component Value Date   CHOLHDL 5.6 02/09/2023   No results found for: "LDLDIRECT"    Radiology: CARDIAC CATHETERIZATION  Result Date: 02/09/2023   Prox Cx to Mid Cx lesion is 100% stenosed.   Mid RCA lesion is 75% stenosed.   Dist Cx lesion is 90% stenosed.   1st Mrg lesion is 80% stenosed.   A drug-eluting stent was successfully placed using a STENT ONYX FRONTIER [id number].   A  drug-eluting stent was successfully placed using a STENT ONYX FRONTIER 2.5X22.   Balloon angioplasty was performed using a BALLN MINITREK RX 2.0X12.   Post intervention, there is a 0% residual stenosis.   Post intervention, there is a 0% residual stenosis.   Post intervention, there is a 10% residual stenosis.   There is mild left ventricular systolic dysfunction.   The left ventricular ejection fraction is 45-50% by visual estimate. 1.  Posterior STEMI 2.  2 vessel coronary artery disease with occluded mid left circumflex (culprit vessel), 80% stenosis ostial OM1, 75% stenosis mid RCA 3.  Reduced left ventricular function with posterior hypokinesis 4.  Successful primary PCI with 0.25 x 22 mm Onyx Frontier DES mid/distal left circumflex, 2.75 x 18 mm Onyx Frontier DES mid left circumflex, and balloon angioplasty ostium OM1 Recommendations 1.  Dual antiplatelet therapy uninterrupted x 1 year 2.  2D echocardiogram 3.  Aggressive risk factor modification   DG Chest Portable 1 View  Result Date: 02/09/2023 CLINICAL DATA:  Chest pain. EXAM: PORTABLE CHEST 1 VIEW COMPARISON:  None Available. FINDINGS: Slight underinflation. Bronchovascular crowding. Normal cardiopericardial silhouette. Slight prominence of the central vasculature. No pneumothorax, effusion or consolidation. Overlapping cardiac leads and defibrillator pads. Degenerative changes seen of the spine. IMPRESSION: Underinflation. Slight central vascular congestion and bronchovascular crowding Electronically Signed   By: Karen Kays M.D.   On: 02/09/2023 14:00    EKG:  Normal sinus rhythm with ST elevation in leads III and aVF  ASSESSMENT AND PLAN:    1.  Posterior STEMI, with acute occlusion of mid left circumflex, status post successful primary PCI with overlapping DES mid and distal left circumflex, and PTCA of ostium of OM1, with preserved left ventricular function with LVEF 45 to 50% with posterior wall hypokinesis 2.  Type 2 diabetes 3.  Tobacco abuse  Recommendations  1.  Dual antiplatelet therapy uninterrupted x 1 year 2.  2D echocardiogram 3.  Aggressive risk factor modification 4.  Add beta-blocker as blood pressure permits 5.  Add ACE/ARB as blood pressure permits 6.  Cardiac rehabilitation following discharge  Signed: Marcina Millard MD,PhD, [facility] 02/09/2023, 4:15 PM

## 2023-02-09 NOTE — H&P (Addendum)
History and Physical    Lauren Whitney:[id number] DOB: July 22, 1966 DOA: 02/09/2023  PCP: Jerl Mina, MD Patient coming from: home  Chief Complaint: chest pain  HPI: Lauren Whitney is a 57 y.o. female with medical history significant of tobacco abuse 1 pack/day, T2DM, obesity BMI 31, HTN, GERD, previous right carotid stent who presented with chest pain.  Patient reports that she started to have chest pain around 10 AM this morning.  Her chest pain was located to midsternal area, pressure-like pain, intense and constant, moderate pain with unclear radiation.  Associated symptoms included nausea and diaphoresis.  Patient called EMS and was brought to [facility] ED for further evaluation.  In the ED, vitals showed T98 F, P75, RR 18, BP 98/78 and O2 sats 97% on room air.  Labs showed nonrevealing CMP, WBC 13.6, troponin 16.  EKG reviewed ST elevation in leads III and aVF.  She was urgently brought to cardiac catheterization laboratory by cardiologist.  I met the patient in ICU after cardiac cath.    Review of Systems: As per HPI otherwise 10 point review of systems negative.  Review of Systems Otherwise negative except as per HPI, including: General: Denies fever, chills, night sweats or unintended weight loss. Resp: Denies cough, wheezing, shortness of breath. Cardiac: Denies palpitations, orthopnea, paroxysmal nocturnal dyspnea.  Positive for chest pain, nausea and diaphoresis GI: Denies abdominal pain,  vomiting, diarrhea or constipation GU: Denies dysuria, frequency, hesitancy or incontinence MS: Denies muscle aches, joint pain or swelling Neuro: Denies headache, neurologic deficits (focal weakness, numbness, tingling), abnormal gait Psych: Denies anxiety, depression, SI/HI/AVH Skin: Denies new rashes or lesions ID: Denies sick contacts, exotic exposures, travel  Past Medical History:  Diagnosis Date   Cholelithiases    Diabetes (HCC)    GERD (gastroesophageal reflux disease)     due to gallbladder-no meds   Hypertension    off meds x 2 years-better control now    Past Surgical History:  Procedure Laterality Date   CAROTID PTA/STENT INTERVENTION Right 06/07/2022   Procedure: CAROTID PTA/STENT INTERVENTION;  Surgeon: Annice Needy, MD;  Location: [facility] INVASIVE CV LAB;  Service: Cardiovascular;  Laterality: Right;   CESAREAN SECTION     x2   WISDOM TOOTH EXTRACTION      SOCIAL HISTORY:  reports that she has been smoking cigarettes. She has a 30.00 pack-year smoking history. She has never used smokeless tobacco. She reports that she does not drink alcohol and does not use drugs.  Allergies  Allergen Reactions   Keflex [Cephalexin] Hives, Itching and Dermatitis    Received Benadryl   Lisinopril Cough   Losartan Other (See Comments)    Blurred vision   Oxycodone-Acetaminophen Other (See Comments)    Severe pain while taking     FAMILY HISTORY: Family History  Problem Relation Age of Onset   COPD Mother    Other Father        COVID     Prior to Admission medications   Medication Sig Start Date End Date Taking? Authorizing Provider  acetaminophen (TYLENOL) 500 MG tablet Take 1,000 mg by mouth every 6 (six) hours as needed (for pain.).    [provider]  atorvastatin (LIPITOR) 20 MG tablet Take 20 mg by mouth daily. 03/22/22   [provider]  Blood Glucose Monitoring Suppl KIT 1 kit by Does not apply route daily as needed. 06/08/22   Georgiana Spinner, NP  clopidogrel (PLAVIX) 75 MG tablet TAKE 1 TABLET BY MOUTH  EVERY DAY Patient not taking: Reported on 12/28/2022 08/25/22   Schnier, Latina Craver, MD  Continuous Blood Gluc Sensor (FREESTYLE LIBRE 2 SENSOR) MISC USE 1 KIT EVERY 14 (FOURTEEN) DAYS FOR GLUCOSE MONITORING 06/08/22   [provider]  doxycycline (VIBRAMYCIN) 100 MG capsule Take 1 capsule (100 mg total) by mouth 2 (two) times daily. 12/28/22   Annice Needy, MD  gabapentin (NEURONTIN) 300 MG capsule Take 1 capsule (300 mg  total) by mouth 3 (three) times daily as needed for up to 10 days (headache, scalp pain). 02/19/22 06/28/22  Merwyn Katos, MD  ibuprofen (ADVIL) 600 MG tablet Take 1 tablet (600 mg total) by mouth every 8 (eight) hours as needed for moderate pain. 04/14/21   Henrene Dodge, MD  metFORMIN (GLUCOPHAGE-XR) 500 MG 24 hr tablet Take 2,000 mg by mouth every evening. 11/14/20   [provider]  SEMGLEE, YFGN, 100 UNIT/ML Pen Inject 40 Units into the skin at bedtime. 02/09/21   [provider]  telmisartan (MICARDIS) 40 MG tablet Take 40 mg by mouth daily. 03/22/22   [provider]  traMADol (ULTRAM) 50 MG tablet Take 1 tablet (50 mg total) by mouth every 6 (six) hours as needed. Patient not taking: Reported on 12/28/2022 06/08/22   Georgiana Spinner, NP    Physical Exam: Vitals:   02/09/23 1523 02/09/23 1528 02/09/23 1600 02/09/23 1630  BP: (!) 92/52 (!) 93/55 102/60 107/64  Pulse: 73 68 71 67  Resp: 16 17 14 16   Temp:      TempSrc:      SpO2: 96% 96% 100% 95%  Weight:   88.6 kg   Height:   5\' 6"  (1.676 m)       Constitutional: NAD, calm, comfortable.  Obesity Eyes: PERRL, lids and conjunctivae normal ENMT: Mucous membranes are moist. Posterior pharynx clear of any exudate or lesions.Normal dentition.  Neck: normal, supple, no masses, no thyromegaly Respiratory: clear to auscultation bilaterally, no wheezing, no crackles. Normal respiratory effort. No accessory muscle use.  Cardiovascular: Regular rate and rhythm, no murmurs / rubs / gallops. No extremity edema. 2+ pedal pulses. No carotid bruits.  Abdomen: no tenderness, no masses palpated. No hepatosplenomegaly. Bowel sounds positive.  Musculoskeletal: no clubbing / cyanosis. No joint deformity upper and lower extremities. Good ROM, no contractures. Normal muscle tone.  Skin: no rashes, lesions, ulcers. No induration.  Right wrist band Neurologic: CN 2-12 grossly intact. Sensation intact, DTR normal. Strength 5/5 in  all 4.  Psychiatric: Normal judgment and insight. Alert and oriented x 3. Normal mood.     Labs on Admission: I have personally reviewed following labs and imaging studies  CBC: Recent Labs  Lab 02/09/23 1259  WBC 13.6*  NEUTROABS 6.2  HGB 14.4  HCT 44.7  MCV 86.5  PLT 366   Basic Metabolic Panel: Recent Labs  Lab 02/09/23 1259  [place] 135  K 3.7  CL 101  CO2 21*  GLUCOSE 324*  BUN 10  CREATININE 0.73  CALCIUM 8.6*   GFR: Estimated Creatinine Clearance: 88 mL/min (by C-G formula based on SCr of 0.73 mg/dL). Liver Function Tests: Recent Labs  Lab 02/09/23 1259  AST 21  ALT 19  ALKPHOS 85  BILITOT 0.7  PROT 6.6  ALBUMIN 3.6   No results for input(s): "LIPASE", "AMYLASE" in the last 168 hours. No results for input(s): "AMMONIA" in the last 168 hours. Coagulation Profile: Recent Labs  Lab 02/09/23 1259  INR 1.0   Cardiac Enzymes:  No results for input(s): "CKTOTAL", "CKMB", "CKMBINDEX", "TROPONINI" in the last 168 hours. BNP (last 3 results) No results for input(s): "PROBNP" in the last 8760 hours. HbA1C: No results for input(s): "HGBA1C" in the last 72 hours. CBG: Recent Labs  Lab 02/09/23 1608  GLUCAP 290*   Lipid Profile: Recent Labs    02/09/23 1259  CHOL 192  HDL 34*  LDLCALC 121*  TRIG 187*  CHOLHDL 5.6   Thyroid Function Tests: No results for input(s): "TSH", "T4TOTAL", "FREET4", "T3FREE", "THYROIDAB" in the last 72 hours. Anemia Panel: No results for input(s): "VITAMINB12", "FOLATE", "FERRITIN", "TIBC", "IRON", "RETICCTPCT" in the last 72 hours. Urine analysis:    Component Value Date/Time   COLORURINE YELLOW (A) 04/07/2021 0840   APPEARANCEUR HAZY (A) 04/07/2021 0840   LABSPEC 1.020 04/07/2021 0840   PHURINE 6.0 04/07/2021 0840   GLUCOSEU >=500 (A) 04/07/2021 0840   HGBUR NEGATIVE 04/07/2021 0840   BILIRUBINUR NEGATIVE 04/07/2021 0840   KETONESUR NEGATIVE 04/07/2021 0840   PROTEINUR 30 (A) 04/07/2021 0840   NITRITE NEGATIVE  04/07/2021 0840   LEUKOCYTESUR NEGATIVE 04/07/2021 0840   Sepsis Labs: !!!!!!!!!!!!!!!!!!!!!!!!!!!!!!!!!!!!!!!!!!!! @LABRCNTIP (procalcitonin:4,lacticidven:4) )No results found for this or any previous visit (from the past 240 hour(s)).   Radiological Exams on Admission: CARDIAC CATHETERIZATION  Result Date: 02/09/2023   Prox Cx to Mid Cx lesion is 100% stenosed.   Mid RCA lesion is 75% stenosed.   Dist Cx lesion is 90% stenosed.   1st Mrg lesion is 80% stenosed.   A drug-eluting stent was successfully placed using a STENT ONYX FRONTIER [id number].   A drug-eluting stent was successfully placed using a STENT ONYX FRONTIER 2.5X22.   Balloon angioplasty was performed using a BALLN MINITREK RX 2.0X12.   Post intervention, there is a 0% residual stenosis.   Post intervention, there is a 0% residual stenosis.   Post intervention, there is a 10% residual stenosis.   There is mild left ventricular systolic dysfunction.   The left ventricular ejection fraction is 45-50% by visual estimate. 1.  Posterior STEMI 2.  2 vessel coronary artery disease with occluded mid left circumflex (culprit vessel), 80% stenosis ostial OM1, 75% stenosis mid RCA 3.  Reduced left ventricular function with posterior hypokinesis 4.  Successful primary PCI with 0.25 x 22 mm Onyx Frontier DES mid/distal left circumflex, 2.75 x 18 mm Onyx Frontier DES mid left circumflex, and balloon angioplasty ostium OM1 Recommendations 1.  Dual antiplatelet therapy uninterrupted x 1 year 2.  2D echocardiogram 3.  Aggressive risk factor modification   DG Chest Portable 1 View  Result Date: 02/09/2023 CLINICAL DATA:  Chest pain. EXAM: PORTABLE CHEST 1 VIEW COMPARISON:  None Available. FINDINGS: Slight underinflation. Bronchovascular crowding. Normal cardiopericardial silhouette. Slight prominence of the central vasculature. No pneumothorax, effusion or consolidation. Overlapping cardiac leads and defibrillator pads. Degenerative changes seen of the spine.  IMPRESSION: Underinflation. Slight central vascular congestion and bronchovascular crowding Electronically Signed   By: Karen Kays M.D.   On: 02/09/2023 14:00     All images have been reviewed by me personally.  EKG: Independently reviewed.   Assessment/Plan Principal Problem:   STEMI (ST elevation myocardial infarction) (HCC) Active Problems:   Essential hypertension, benign   Diabetes (HCC)   Hyperlipidemia   Acute ST elevation myocardial infarction (STEMI) of posterior wall (HCC)   Uncontrolled type 2 diabetes mellitus with hyperglycemia, with long-term current use of insulin (HCC)   Obesity (BMI 30-39.9)   Tobacco abuse   Leukocytosis  Assessment and Plan:   Assessment Plan  # STEMI  LHC report- status post successful primary PCI with overlapping DES mid and distal left circumflex, and PTCA of ostium of OM1, with preserved left ventricular function with LVEF 45 to 50% with posterior wall hypokinesis   - defer to cardology - per cardiology instruction, patient was admitted to progressive unit  # T2DM with hyperglycemia # Obesity with BMI 31  She said that she takes basal insulin levemir 50 u HS -Last hemoglobin A1c 8.1 on 06/07/2022 -Repeat hemoglobin A1c pending -Glucose ACHS - insulin levemir 20 u HS  and SSI -Weight loss per PCP as outpatient   # HTN -Chronic and stable  # Tobacco abuse 1 pack/day -Tobacco cessation education done at bedside -Nicotine patch ordered  # Previous right carotid stent -Noted  # Leukocytosis -WBC 13.6 with no evidence of infection, which is likely reactive to STEMI -Follow CBC       Body mass index is 31.53 kg/m.      DVT prophylaxis: SCD s/p cath Code Status: full code Family Communication: Family at bedside Consults called: Cardiology already on board Admission status: Inpatient  Status is: Inpatient Remains inpatient appropriate because: Require minimum 2 night stay   Time Spent: 65 minutes.  >50% of the time  was devoted to discussing the patients care, assessment, plan and disposition with other care givers along with counseling the patient about the risks and benefits of treatment.    Dede Query MD [facility]  If 7PM-7AM, please contact night-coverage   02/09/2023, 4:56 PM

## 2023-02-09 NOTE — ED Notes (Signed)
Pt transported to Cath Lab

## 2023-02-09 NOTE — ED Provider Notes (Signed)
[facility] Provider Note    Event Date/Time   First MD Initiated Contact with Patient 02/09/23 1259     (approximate)   History   Code STEMI   HPI  Lauren Whitney is a 57 y.o. female with a history of diabetes presents to the ER for evaluation of midsternal chest tightness and pressure starting today while at work.  EMS was called out had EKG concerning for acute MI code STEMI was called.  She arrives to the ER complaining of mild to moderate discomfort.  Does have a history of carotid stents.  Denies any shortness of breath.     Physical Exam   Triage Vital Signs: ED Triage Vitals  Enc Vitals Group     BP      Pulse      Resp      Temp      Temp src      SpO2      Weight      Height      Head Circumference      Peak Flow      Pain Score      Pain Loc      Pain Edu?      Excl. in GC?     Most recent vital signs: Vitals:   02/09/23 1304 02/09/23 1309  BP: 90/78   Pulse:  75  Resp: 18   Temp:  98 F (36.7 C)  SpO2:  95%     Constitutional: Alert  Eyes: Conjunctivae are normal.  Head: Atraumatic. Nose: No congestion/rhinnorhea. Mouth/Throat: Mucous membranes are moist.   Neck: Painless ROM.  Cardiovascular:   Good peripheral circulation. Respiratory: Normal respiratory effort.  No retractions.  Gastrointestinal: Soft and nontender.  Musculoskeletal:  no deformity Neurologic:  MAE spontaneously. No gross focal neurologic deficits are appreciated.  Skin:  Skin is warm, dry and intact. No rash noted. Psychiatric: Mood and affect are normal. Speech and behavior are normal.    ED Results / Procedures / Treatments   Labs (all labs ordered are listed, but only abnormal results are displayed) Labs Reviewed  CBC WITH DIFFERENTIAL/PLATELET - Abnormal; Notable for the following components:      Result Value   WBC 13.6 (*)    RBC 5.17 (*)    Lymphs Abs 6.0 (*)    Eosinophils Absolute 0.6 (*)    All other components within  normal limits  HEMOGLOBIN A1C  PROTIME-INR  APTT  COMPREHENSIVE METABOLIC PANEL  LIPID PANEL  I-STAT CG4 LACTIC ACID, ED  TROPONIN I (HIGH SENSITIVITY)     EKG  ED ECG REPORT I, Willy Eddy, the attending physician, personally viewed and interpreted this ECG.   Date: 02/09/2023  EKG Time: 12:57  Rate: 70  Rhythm: sinus  Axis: normal  Intervals: normalqt  ST&T Change: inferior stemi    RADIOLOGY Please see ED Course for my review and interpretation.  I personally reviewed all radiographic images ordered to evaluate for the above acute complaints and reviewed radiology reports and findings.  These findings were personally discussed with the patient.  Please see medical record for radiology report.    PROCEDURES:  Critical Care performed: Yes, see critical care procedure note(s)  .Critical Care  Performed by: Willy Eddy, MD Authorized by: Willy Eddy, MD   Critical care provider statement:    Critical care time (minutes):  15   Critical care was necessary to treat or prevent imminent or life-threatening deterioration of the  following conditions:  Cardiac failure   Critical care was time spent personally by me on the following activities:  Ordering and performing treatments and interventions, ordering and review of laboratory studies, ordering and review of radiographic studies, pulse oximetry, re-evaluation of patient's condition, review of old charts, obtaining history from patient or surrogate, examination of patient, evaluation of patient's response to treatment, discussions with primary provider, discussions with consultants and development of treatment plan with patient or surrogate    MEDICATIONS ORDERED IN ED: Medications  0.9 %  sodium chloride infusion (has no administration in time range)  aspirin chewable tablet 324 mg (has no administration in time range)  heparin injection 4,000 Units (has no administration in time range)  sodium  chloride 0.9 % bolus 1,000 mL (has no administration in time range)     IMPRESSION / MDM / ASSESSMENT AND PLAN / ED COURSE  I reviewed the triage vital signs and the nursing notes.                              Differential diagnosis includes, but is not limited to, ACS, pericarditis, esophagitis, boerhaaves, pe, dissection, pna, bronchitis, costochondritis  Patient presenting to the ER for evaluation of symptoms as described above.  Based on symptoms, risk factors and considered above differential, this presenting complaint could reflect a potentially life-threatening illness therefore the patient will be placed on continuous pulse oximetry and telemetry for monitoring.  Laboratory evaluation will be sent to evaluate for the above complaints.  Chest x-ray by my review and interpretation without evidence of pneumothorax or consolidation.    Clinical Course as of 02/09/23 1315  Wed Feb 09, 2023  1312 Patient will be taken emergently to cardiac Cath Lab.  She is currently stable for transfer. [PR]    Clinical Course User Index [PR] Willy Eddy, MD     FINAL CLINICAL IMPRESSION(S) / ED DIAGNOSES   Final diagnoses:  ST elevation myocardial infarction (STEMI), unspecified artery (HCC)     Rx / DC Orders   ED Discharge Orders     None        Note:  This document was prepared using Dragon voice recognition software and may include unintentional dictation errors.    Willy Eddy, MD 02/09/23 1315

## 2023-02-10 ENCOUNTER — Encounter: Payer: Self-pay | Admitting: Cardiology

## 2023-02-10 DIAGNOSIS — I2129 ST elevation (STEMI) myocardial infarction involving other sites: Secondary | ICD-10-CM | POA: Diagnosis not present

## 2023-02-10 LAB — BASIC METABOLIC PANEL
Anion gap: 10 (ref 5–15)
BUN: 14 mg/dL (ref 6–20)
CO2: 19 mmol/L — ABNORMAL LOW (ref 22–32)
Calcium: 8.1 mg/dL — ABNORMAL LOW (ref 8.9–10.3)
Chloride: 105 mmol/L (ref 98–111)
Creatinine, Ser: 0.59 mg/dL (ref 0.44–1.00)
GFR, Estimated: >60 mL/min (ref >60)
Glucose, Bld: 314 mg/dL — ABNORMAL HIGH (ref 70–99)
Potassium: 4 mmol/L (ref 3.5–5.1)
Sodium: 134 mmol/L — ABNORMAL LOW (ref 135–145)

## 2023-02-10 LAB — CBC
HCT: 38.7 % (ref 36.0–46.0)
Hemoglobin: 12.5 g/dL (ref 12.0–15.0)
MCH: 28.1 pg (ref 26.0–34.0)
MCHC: 32.3 g/dL (ref 30.0–36.0)
MCV: 87 fL (ref 80.0–100.0)
Platelets: 324 10*3/uL (ref 150–400)
RBC: 4.45 MIL/uL (ref 3.87–5.11)
RDW: 13.3 % (ref 11.5–15.5)
WBC: 13.2 10*3/uL — ABNORMAL HIGH (ref 4.0–10.5)
nRBC: 0 % (ref 0.0–0.2)

## 2023-02-10 LAB — GLUCOSE, CAPILLARY
Glucose-Capillary: 308 mg/dL — ABNORMAL HIGH (ref 70–99)
Glucose-Capillary: 299 mg/dL — ABNORMAL HIGH (ref 70–99)
Glucose-Capillary: 233 mg/dL — ABNORMAL HIGH (ref 70–99)
Glucose-Capillary: 213 mg/dL — ABNORMAL HIGH (ref 70–99)

## 2023-02-10 LAB — TSH: TSH: 0.731 u[IU]/mL (ref 0.350–4.500)

## 2023-02-10 LAB — LIPOPROTEIN A (LPA): Lipoprotein (a): 91.3 nmol/L — ABNORMAL HIGH (ref <75.0)

## 2023-02-10 MED ORDER — METOPROLOL SUCCINATE ER 25 MG PO TB24
12.5000 mg | ORAL_TABLET | Freq: Every day | ORAL | Status: DC
  Filled 2023-02-10: qty 0.5

## 2023-02-10 NOTE — Progress Notes (Signed)
PROGRESS NOTE    Lauren Whitney  ZOX:[id number] DOB: 04-12-1966 DOA: 02/09/2023 PCP: Jerl Mina, MD    Brief Narrative:  57 y.o. female with medical history significant of tobacco abuse 1 pack/day, T2DM, obesity BMI 31, HTN, GERD, previous right carotid stent who presented with chest pain.   Patient reports that she started to have chest pain around 10 AM this morning.  Her chest pain was located to midsternal area, pressure-like pain, intense and constant, moderate pain with unclear radiation.  Associated symptoms included nausea and diaphoresis.  Patient called EMS and was brought to [facility] ED for further evaluation.   In the ED, vitals showed T98 F, P75, RR 18, BP 98/78 and O2 sats 97% on room air.  Labs showed nonrevealing CMP, WBC 13.6, troponin 16.  EKG reviewed ST elevation in leads III and aVF.  She was urgently brought to cardiac catheterization laboratory by cardiologist.   Assessment & Plan:   Principal Problem:   STEMI (ST elevation myocardial infarction) (HCC) Active Problems:   Essential hypertension, benign   Diabetes (HCC)   Hyperlipidemia   Acute ST elevation myocardial infarction (STEMI) of posterior wall (HCC)   Uncontrolled type 2 diabetes mellitus with hyperglycemia, with long-term current use of insulin (HCC)   Obesity (BMI 30-39.9)   Tobacco abuse   Leukocytosis  STEMI Status post left heart catheterization on 6/12.  Successful primary PCI with overlapping DES mid and distal left circumflex.  PTCA of ostium of OM1 Plan: Patient has been stable.  Chest pain-free.  Transfer to cardiac telemetry.  Continue dual antiplatelet therapy.  Aggressive risk factor modification.  Cardiology to follow-up.  Follow-up 2D echocardiogram.  Anticipate discharge 6/14.  Type 2 diabetes mellitus with hyperglycemia Hemoglobin A1c greater than 12.  Poor control.  Diabetes coordinator consulted.  Recommendations appreciated.  Basal bolus regimen.  Essential  hypertension Blood pressure running rather low.  Preventing addition of further goal-directed medical therapy.  Monitor overnight.  Tobacco use Counseled on cessation  Hyperlipidemia High intensity statin  Obesity BMI 31.5.  Complicates overall care and prognosis.   DVT prophylaxis: Lovenox Code Status: Full Family Communication: None today Disposition Plan: Status is: Inpatient Remains inpatient appropriate because: STEMI post PCI.  Postcatheterization day #1.  Anticipate discharge 6/14.   Level of care: Telemetry Cardiac  Consultants:  Cardiology  Procedures:  Left heart authorization.  PCI with DES placement  Antimicrobials: None   Subjective: Seen and examined.  Resting comfortably.  No apparent distress  Objective: Vitals:   02/10/23 0927 02/10/23 1200 02/10/23 1222 02/10/23 1300  BP:   100/66   Pulse: 64  (!) 55 61  Resp: 16 17 (!) 21 19  Temp:   99 F (37.2 C)   TempSrc:   Oral   SpO2: 96%  97% 100%  Weight:      Height:        Intake/Output Summary (Last 24 hours) at 02/10/2023 1358 Last data filed at 02/10/2023 1200 Gross per 24 hour  Intake 2031.88 ml  Output 450 ml  Net 1581.88 ml   Filed Weights   02/09/23 1305 02/09/23 1600  Weight: 83.9 kg 88.6 kg    Examination:  General exam: Appears calm and comfortable  Respiratory system: Clear to auscultation. Respiratory effort normal. Cardiovascular system: S1-S2, RRR, no murmurs, no pedal edema Gastrointestinal system: Soft, NT/ND, normal bowel sounds Central nervous system: Alert and oriented. No focal neurological deficits. Extremities: Symmetric 5 x 5 power. Skin: No rashes, lesions or ulcers  Psychiatry: Judgement and insight appear normal. Mood & affect appropriate.     Data Reviewed: I have personally reviewed following labs and imaging studies  CBC: Recent Labs  Lab 02/09/23 1259 02/10/23 0359  WBC 13.6* 13.2*  NEUTROABS 6.2  --   HGB 14.4 12.5  HCT 44.7 38.7  MCV 86.5  87.0  PLT 366 324   Basic Metabolic Panel: Recent Labs  Lab 02/09/23 1259 02/10/23 0359  NA 135 134*  K 3.7 4.0  CL 101 105  CO2 21* 19*  GLUCOSE 324* 314*  BUN 10 14  CREATININE 0.73 0.59  CALCIUM 8.6* 8.1*   GFR: Estimated Creatinine Clearance: 88 mL/min (by C-G formula based on SCr of 0.59 mg/dL). Liver Function Tests: Recent Labs  Lab 02/09/23 1259  AST 21  ALT 19  ALKPHOS 85  BILITOT 0.7  PROT 6.6  ALBUMIN 3.6   No results for input(s): "LIPASE", "AMYLASE" in the last 168 hours. No results for input(s): "AMMONIA" in the last 168 hours. Coagulation Profile: Recent Labs  Lab 02/09/23 1259  INR 1.0   Cardiac Enzymes: No results for input(s): "CKTOTAL", "CKMB", "CKMBINDEX", "TROPONINI" in the last 168 hours. BNP (last 3 results) No results for input(s): "PROBNP" in the last 8760 hours. HbA1C: Recent Labs    02/09/23 1259  HGBA1C 12.6*   CBG: Recent Labs  Lab 02/09/23 1608 02/09/23 2126 02/10/23 0722 02/10/23 1115  GLUCAP 290* 239* 308* 299*   Lipid Profile: Recent Labs    02/09/23 1259  CHOL 192  HDL 34*  LDLCALC 121*  TRIG 187*  CHOLHDL 5.6   Thyroid Function Tests: Recent Labs    02/10/23 0359  TSH 0.731   Anemia Panel: No results for input(s): "VITAMINB12", "FOLATE", "FERRITIN", "TIBC", "IRON", "RETICCTPCT" in the last 72 hours. Sepsis Labs: No results for input(s): "PROCALCITON", "LATICACIDVEN" in the last 168 hours.  Recent Results (from the past 240 hour(s))  MRSA Next Gen by PCR, Nasal     Status: None   Collection Time: 02/09/23  4:08 PM   Specimen: Nasal Mucosa; Nasal Swab  Result Value Ref Range Status   MRSA by PCR Next Gen NOT DETECTED NOT DETECTED Final    Comment: (NOTE) The GeneXpert MRSA Assay (FDA approved for NASAL specimens only), is one component of a comprehensive MRSA colonization surveillance program. It is not intended to diagnose MRSA infection nor to guide or monitor treatment for MRSA infections. Test  performance is not FDA approved in patients less than 35 years old. Performed at [facility], [address], [place], [place] [address]          Radiology Studies: CARDIAC CATHETERIZATION  Result Date: 02/09/2023   Prox Cx to Mid Cx lesion is 100% stenosed.   Mid RCA lesion is 75% stenosed.   Dist Cx lesion is 90% stenosed.   1st Mrg lesion is 80% stenosed.   A drug-eluting stent was successfully placed using a STENT ONYX FRONTIER [id number].   A drug-eluting stent was successfully placed using a STENT ONYX FRONTIER 2.5X22.   Balloon angioplasty was performed using a BALLN MINITREK RX 2.0X12.   Post intervention, there is a 0% residual stenosis.   Post intervention, there is a 0% residual stenosis.   Post intervention, there is a 10% residual stenosis.   There is mild left ventricular systolic dysfunction.   The left ventricular ejection fraction is 45-50% by visual estimate. 1.  Posterior STEMI 2.  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  Reduced left ventricular function with posterior hypokinesis 4.  Successful primary PCI with 0.25 x 22 mm Onyx Frontier DES mid/distal left circumflex, 2.75 x 18 mm Onyx Frontier DES mid left circumflex, and balloon angioplasty ostium OM1 Recommendations 1.  Dual antiplatelet therapy uninterrupted x 1 year 2.  2D echocardiogram 3.  Aggressive risk factor modification   DG Chest Portable 1 View  Result Date: 02/09/2023 CLINICAL DATA:  Chest pain. EXAM: PORTABLE CHEST 1 VIEW COMPARISON:  None Available. FINDINGS: Slight underinflation. Bronchovascular crowding. Normal cardiopericardial silhouette. Slight prominence of the central vasculature. No pneumothorax, effusion or consolidation. Overlapping cardiac leads and defibrillator pads. Degenerative changes seen of the spine. IMPRESSION: Underinflation. Slight central vascular congestion and bronchovascular crowding  Electronically Signed   By: Karen Kays M.D.   On: 02/09/2023 14:00        Scheduled Meds:  aspirin  81 mg Oral Daily   atorvastatin  80 mg Oral Daily   Chlorhexidine Gluconate Cloth  6 each Topical Daily   heparin  4,000 Units Intravenous Once   insulin aspart  0-9 Units Subcutaneous TID WC   insulin detemir  20 Units Subcutaneous QHS   metoprolol succinate  12.5 mg Oral Daily   nicotine  14 mg Transdermal Daily   sodium chloride flush  3 mL Intravenous Q12H   ticagrelor  90 mg Oral BID   Continuous Infusions:  sodium chloride Stopped (02/09/23 1646)   sodium chloride       LOS: 1 day     Tresa Moore, MD [facility]   If 7PM-7AM, please contact night-coverage  02/10/2023, 1:58 PM

## 2023-02-10 NOTE — Evaluation (Signed)
Physical Therapy Evaluation Patient Details Name: Lauren Whitney MRN: [id number] DOB: 12/21/1965 Today's Date: 02/10/2023  History of Present Illness  57 y.o. female with medical history significant of tobacco abuse 1 pack/day, T2DM, obesity BMI 31, HTN, GERD, previous right carotid stent who presented with chest pain.  She arrives with chest chest pain, code STEMI, to cath lab with  underwent primary PCI receiving overlapping eluding stents in the mid and distal left circumflex, and underwent PTCA of the ostium of OM1.  Clinical Impression  Pt pleasant and eager to do some out of room ambulation. She ultimately did quite well showing ability to ambulate ~500 ft w/o AD, w/o O2, w/o change in vitals and w/o fatigue.  She was able to safely and confidently maintain consistent cadence and community appropriate speed.  Pt safe to d/c home and showed good overall tolerance.       Recommendations for follow up therapy are one component of a multi-disciplinary discharge planning process, led by the attending physician.  Recommendations may be updated based on patient status, additional functional criteria and insurance authorization.  Follow Up Recommendations       Assistance Recommended at Discharge PRN  Patient can return home with the following       Equipment Recommendations None recommended by PT  Recommendations for Other Services       Functional Status Assessment Patient has not had a recent decline in their functional status     Precautions / Restrictions Precautions Precautions: None Restrictions Weight Bearing Restrictions: No      Mobility  Bed Mobility Overal bed mobility: Modified Independent                  Transfers Overall transfer level: Modified independent Equipment used: None               General transfer comment: easily rises to standing w/o issue    Ambulation/Gait Ambulation/Gait assistance: Min guard Gait Distance (Feet): 500  Feet Assistive device: None         General Gait Details: Pt easily able to assume community appropriate speed and cadence with no AD, no O2.  Vitals remained stable with O2 in the high 90s and HR <110.  Pt with only very minimal fatigue and no CP, or other issues with the prolonged bout of ambualtion  Stairs            Wheelchair Mobility    Modified Rankin (Stroke Patients Only)       Balance Overall balance assessment: Independent                                           Pertinent Vitals/Pain Pain Assessment Pain Assessment: No/denies pain (mild nausea)    Home Living Family/patient expects to be discharged to:: Private residence Living Arrangements: Spouse/significant other Available Help at Discharge: Family;Available 24 hours/day Type of Home: House Home Access: Stairs to enter Entrance Stairs-Rails:  (yes) Entrance Stairs-Number of Steps: 2 Alternate Level Stairs-Number of Steps: flight Home Layout: Two level Home Equipment: None      Prior Function Prior Level of Function : Independent/Modified Independent;Working/employed;Driving             Mobility Comments: independent and able to be active, works Medical sales representative job) ADLs Comments: independent     Higher education careers adviser        Extremity/Trunk Assessment   Upper  Extremity Assessment Upper Extremity Assessment: Overall WFL for tasks assessed    Lower Extremity Assessment Lower Extremity Assessment: Overall WFL for tasks assessed       Communication   Communication: No difficulties  Cognition Arousal/Alertness: Awake/alert Behavior During Therapy: WFL for tasks assessed/performed Overall Cognitive Status: Within Functional Limits for tasks assessed                                          General Comments General comments (skin integrity, edema, etc.): Pt did very well, feels close to her baseline and eager to get home    Exercises     Assessment/Plan     PT Assessment Patient does not need any further PT services  PT Problem List         PT Treatment Interventions      PT Goals (Current goals can be found in the Care Plan section)  Acute Rehab PT Goals Patient Stated Goal: go home PT Goal Formulation: All assessment and education complete, DC therapy    Frequency       Co-evaluation               AM-PAC PT "6 Clicks" Mobility  Outcome Measure Help needed turning from your back to your side while in a flat bed without using bedrails?: None Help needed moving from lying on your back to sitting on the side of a flat bed without using bedrails?: None Help needed moving to and from a bed to a chair (including a wheelchair)?: None Help needed standing up from a chair using your arms (e.g., wheelchair or bedside chair)?: None Help needed to walk in hospital room?: None Help needed climbing 3-5 steps with a railing? : None 6 Click Score: 24    End of Session Equipment Utilized During Treatment: Gait belt Activity Tolerance: Patient tolerated treatment well Patient left: in chair;with call bell/phone within reach Nurse Communication: Mobility status PT Visit Diagnosis: Difficulty in walking, not elsewhere classified (R26.2)    Time: 0981-1914 PT Time Calculation (min) (ACUTE ONLY): 21 min   Charges:   PT Evaluation $PT Eval Low Complexity: 1 Low          Malachi Pro, DPT 02/10/2023, 12:53 PM

## 2023-02-10 NOTE — Progress Notes (Signed)
[facility] Cardiology  CARDIOLOGY CONSULT NOTE  Patient ID: Lauren Whitney MRN: [id number] DOB/AGE: [birth year] 57 y.o.  Admit date: 02/09/2023 Referring Physician Roxan Hockey Primary Physician [facility] Primary Cardiologist  Reason for Consultation posterior ST elevation myocardial infarction  HPI: 57 year old female referred for code STEMI.  Patient has a history of insulin requiring diabetes and tobacco abuse.  She was in usual state of health until 10 AM today when she experienced 5 out of 10 substernal chest pain.  Was brought to [facility] ED where ECG revealed ST elevations in leads III and aVF.  She was urgently brought to the cardiac catheterization laboratory where coronary angiography revealed occluded mid left circumflex.  The patient underwent primary PCI receiving overlapping eluding stents in the mid and distal left circumflex, and underwent PTCA of the ostium of OM1 with an excellent angiographic result.  Ventriculography revealed mildly reduced left ventricular function with posterior wall hypokinesis.  Interval History:  - chest pain free this AM  - BP borderline low. No shortness of breath, dizziness, presyncope - seen by PT and did well, ambulatory without assistance - echo pending read  Review of systems complete and found to be negative unless listed above     Past Medical History:  Diagnosis Date   Cholelithiases    Diabetes (HCC)    GERD (gastroesophageal reflux disease)    due to gallbladder-no meds   Hypertension    off meds x 2 years-better control now    Past Surgical History:  Procedure Laterality Date   CAROTID PTA/STENT INTERVENTION Right 06/07/2022   Procedure: CAROTID PTA/STENT INTERVENTION;  Surgeon: Annice Needy, MD;  Location: [facility] INVASIVE CV LAB;  Service: Cardiovascular;  Laterality: Right;   CESAREAN SECTION     x2   CORONARY/GRAFT ACUTE MI REVASCULARIZATION N/A 02/09/2023   Procedure: Coronary/Graft Acute MI Revascularization;  Surgeon: Marcina Millard, MD;  Location: [facility] INVASIVE CV LAB;  Service: Cardiovascular;  Laterality: N/A;   LEFT HEART CATH AND CORONARY ANGIOGRAPHY N/A 02/09/2023   Procedure: LEFT HEART CATH AND CORONARY ANGIOGRAPHY;  Surgeon: Marcina Millard, MD;  Location: [facility] INVASIVE CV LAB;  Service: Cardiovascular;  Laterality: N/A;   WISDOM TOOTH EXTRACTION      Medications Prior to Admission  Medication Sig Dispense Refill Last Dose   acetaminophen (TYLENOL) 500 MG tablet Take 1,000 mg by mouth every 6 (six) hours as needed (for pain.).   Past Week   Continuous Blood Gluc Sensor (FREESTYLE LIBRE 2 SENSOR) MISC USE 1 KIT EVERY 14 (FOURTEEN) DAYS FOR GLUCOSE MONITORING   Past Week   metFORMIN (GLUCOPHAGE-XR) 500 MG 24 hr tablet Take 2,000 mg by mouth every evening.   02/09/2023   atorvastatin (LIPITOR) 20 MG tablet Take 20 mg by mouth daily. (Patient not taking: Reported on 02/10/2023)   Not Taking   Blood Glucose Monitoring Suppl KIT 1 kit by Does not apply route daily as needed. 1 kit 0    SEMGLEE, YFGN, 100 UNIT/ML Pen Inject 50 Units into the skin at bedtime.   02/08/2023   telmisartan (MICARDIS) 40 MG tablet Take 40 mg by mouth daily. (Patient not taking: Reported on 02/10/2023)   Not Taking   Social History   Socioeconomic History   Marital status: Married    Spouse name: Not on file   Number of children: Not on file   Years of education: Not on file   Highest education level: Not on file  Occupational History   Not on file  Tobacco Use  Smoking status: Every Day    Packs/day: 1.00    Years: 30.00    Additional pack years: 0.00    Total pack years: 30.00    Types: Cigarettes   Smokeless tobacco: Never  Vaping Use   Vaping Use: Never used  Substance and Sexual Activity   Alcohol use: Never   Drug use: Never   Sexual activity: Yes  Other Topics Concern   Not on file  Social History Narrative   Lives with husband and son.  4 pets; cats.    Social Determinants of Health   Financial  Resource Strain: Not on file  Food Insecurity: No Food Insecurity (06/07/2022)   Hunger Vital Sign    Worried About Running Out of Food in the Last Year: Never true    Ran Out of Food in the Last Year: Never true  Transportation Needs: No Transportation Needs (06/07/2022)   PRAPARE - Administrator, Civil Service (Medical): No    Lack of Transportation (Non-Medical): No  Physical Activity: Not on file  Stress: Not on file  Social Connections: Not on file  Intimate Partner Violence: Not At Risk (06/07/2022)   Humiliation, Afraid, Rape, and Kick questionnaire    Fear of Current or Ex-Partner: No    Emotionally Abused: No    Physically Abused: No    Sexually Abused: No    Family History  Problem Relation Age of Onset   COPD Mother    Other Father        COVID    PHYSICAL EXAM  General: Pleasant middle aged female. Well developed, well nourished, in no acute distress. Sitting up in stepdown bed.  HEENT:  Normocephalic and atraumatic Neck:  No JVD.  Lungs: normal respiratory effort on room air. Clear bilaterally to auscultation  Heart: HRRR . Normal S1 and S2 without gallops or murmurs.  Abdomen: nondistended appearing Msk:  Normal strength and tone for age. Extremities: No clubbing, cyanosis or edema.   Neuro: Alert and oriented X 3. Psych:  Good affect, responds appropriately  Labs:   Lab Results  Component Value Date   WBC 13.2 (H) 02/10/2023   HGB 12.5 02/10/2023   HCT 38.7 02/10/2023   MCV 87.0 02/10/2023   PLT 324 02/10/2023    Recent Labs  Lab 02/09/23 1259 02/10/23 0359  NA 135 134*  K 3.7 4.0  CL 101 105  CO2 21* 19*  BUN 10 14  CREATININE 0.73 0.59  CALCIUM 8.6* 8.1*  PROT 6.6  --   BILITOT 0.7  --   ALKPHOS 85  --   ALT 19  --   AST 21  --   GLUCOSE 324* 314*    No results found for: "CKTOTAL", "CKMB", "CKMBINDEX", "TROPONINI"  Lab Results  Component Value Date   CHOL 192 02/09/2023   Lab Results  Component Value Date   HDL 34  (L) 02/09/2023   Lab Results  Component Value Date   LDLCALC 121 (H) 02/09/2023   Lab Results  Component Value Date   TRIG 187 (H) 02/09/2023   Lab Results  Component Value Date   CHOLHDL 5.6 02/09/2023   No results found for: "LDLDIRECT"    Radiology: CARDIAC CATHETERIZATION  Result Date: 02/09/2023   Prox Cx to Mid Cx lesion is 100% stenosed.   Mid RCA lesion is 75% stenosed.   Dist Cx lesion is 90% stenosed.   1st Mrg lesion is 80% stenosed.   A drug-eluting stent was successfully  placed using a STENT ONYX FRONTIER [id number].   A drug-eluting stent was successfully placed using a STENT ONYX FRONTIER 2.5X22.   Balloon angioplasty was performed using a BALLN MINITREK RX 2.0X12.   Post intervention, there is a 0% residual stenosis.   Post intervention, there is a 0% residual stenosis.   Post intervention, there is a 10% residual stenosis.   There is mild left ventricular systolic dysfunction.   The left ventricular ejection fraction is 45-50% by visual estimate. 1.  Posterior STEMI 2.  2 vessel coronary artery disease with occluded mid left circumflex (culprit vessel), 80% stenosis ostial OM1, 75% stenosis mid RCA 3.  Reduced left ventricular function with posterior hypokinesis 4.  Successful primary PCI with 0.25 x 22 mm Onyx Frontier DES mid/distal left circumflex, 2.75 x 18 mm Onyx Frontier DES mid left circumflex, and balloon angioplasty ostium OM1 Recommendations 1.  Dual antiplatelet therapy uninterrupted x 1 year 2.  2D echocardiogram 3.  Aggressive risk factor modification   DG Chest Portable 1 View  Result Date: 02/09/2023 CLINICAL DATA:  Chest pain. EXAM: PORTABLE CHEST 1 VIEW COMPARISON:  None Available. FINDINGS: Slight underinflation. Bronchovascular crowding. Normal cardiopericardial silhouette. Slight prominence of the central vasculature. No pneumothorax, effusion or consolidation. Overlapping cardiac leads and defibrillator pads. Degenerative changes seen of the spine.  IMPRESSION: Underinflation. Slight central vascular congestion and bronchovascular crowding Electronically Signed   By: Karen Kays M.D.   On: 02/09/2023 14:00    EKG: Normal sinus rhythm with ST elevation in leads III and aVF  ASSESSMENT AND PLAN:    1.  Posterior STEMI, with acute occlusion of mid left circumflex, status post successful primary PCI with overlapping DES mid and distal left circumflex, and PTCA of ostium of OM1, with preserved left ventricular function with LVEF 45 to 50% with posterior wall hypokinesis 2.  Type 2 diabetes, poorly controlled. A1c 12.6% 3.  Tobacco abuse, current 1PPD, motivated to quit by using patches vs cold [place]  Recommendations  1.  Dual antiplatelet therapy uninterrupted x 1 year with aspirin 81mg  daily + ticagrelor 90mg  BID  2.  2D echocardiogram, pending read  3.  Aggressive risk factor modification 4.  Metoprolol XL 12.5mg  daily as BP permits (currently mid 90s/50s) 5.  Low BP precluding addition of ACE/ARB 6.  Atorvastatin 80mg  daily  7.  Could benefit from SGLT2i + GLP-1 outpatient.  8.  Transfer out of stepdown to tele today.  9.  Anticipate discharge readiness from a cardiac perspective, tomorrow morning 6/14 10.  Cardiac rehabilitation following discharge  This patient's plan of care was discussed and created with Dr. Darrold Junker and he is in agreement.    Signed: Rebeca Allegra , PA-C  02/10/2023, 9:38 AM

## 2023-02-10 NOTE — Inpatient Diabetes Management (Addendum)
Inpatient Diabetes Program Recommendations  AACE/ADA: New Consensus Statement on Inpatient Glycemic Control (2015)  Target Ranges:  Prepandial:   less than 140 mg/dL      Peak postprandial:   less than 180 mg/dL (1-2 hours)      Critically ill patients:  140 - 180 mg/dL    Latest Reference Range & Units 06/07/22 18:49 02/09/23 12:59  Hemoglobin A1C 4.8 - 5.6 % 8.1 (H) 12.6 (H)  314 mg/dl  (H): Data is abnormally high  Latest Reference Range & Units 02/09/23 16:08 02/09/23 21:26  Glucose-Capillary 70 - 99 mg/dL 161 (H)  3 units Novolog @1836  239 (H)    20 units Levemir  (H): Data is abnormally high  Latest Reference Range & Units 02/10/23 07:22  Glucose-Capillary 70 - 99 mg/dL 096 (H)  (H): Data is abnormally high   Admit with: STEMI  History: DM2  Home DM Meds: Freestyle 2 CGM        Metformin 2000 mg QPM        Semglee 40 units QHS  Current Orders: Levemir 20 units QHS      Novolog Sensitive Correction Scale/ SSI (0-9 units) TID AC    ENDO: Dr. Tedd Sias Last Seen 06/28/2022--A1c at that visit was 7.8%    MD- Note CBG 308 this AM.  Takes larger dose of basal insulin at home per Home Med Rec.  Please consider:  Increase Levemir to 30 units QHS   May consider also giving an extra 10 units Levemir X 1 dose this AM as well    Addendum 10:50am--Met w/ pt at bedside.  Pt A&O and planning for transfer to floor.  Reviewed last ENDO visit and last A1c with pt from October 2023.  Discussed with pt current A1c 12.6%--reviewed average with pt--Pt shocked to hear her A1c was so high.  Has been taking her meds but has not been checking glucose levels for about 1 month.  Has Freestyle Libre 2 with a scanner at home but states she just never bothered to start new FSL2 (has new sensors at home) and has been eating poorly.  I asked pt if she had any questions about nutrition or using the FSL 2 and pt stated she did not have any questions--Just told me she "needs to do better".  I  strongly encouraged pt to restart FSL2 when she goes home and to make follow up appt with ENDO.  Emphasized the importance of good CBG control especially in light of pt having a STEMI.     --Will follow patient during hospitalization--  Ambrose Finland RN, MSN, CDCES Diabetes Coordinator Inpatient Glycemic Control Team Team Pager: [phone] (8a-5p)

## 2023-02-11 DIAGNOSIS — I2129 ST elevation (STEMI) myocardial infarction involving other sites: Secondary | ICD-10-CM | POA: Diagnosis not present

## 2023-02-11 LAB — GLUCOSE, CAPILLARY: Glucose-Capillary: 244 mg/dL — ABNORMAL HIGH (ref 70–99)

## 2023-02-11 LAB — ECHOCARDIOGRAM COMPLETE
Area-P 1/2: 4.07 cm2
Height: 66 in
S' Lateral: 3.1 cm
Weight: 3125.24 oz

## 2023-02-11 MED ORDER — NICOTINE 14 MG/24HR TD PT24: 14.0000 mg | MEDICATED_PATCH | Freq: Every day | TRANSDERMAL | 0 refills | Status: AC

## 2023-02-11 MED ORDER — TICAGRELOR 90 MG PO TABS: 90.0000 mg | ORAL_TABLET | Freq: Two times a day (BID) | ORAL | 0 refills | Status: AC

## 2023-02-11 MED ORDER — ASPIRIN 81 MG PO CHEW: 81.0000 mg | CHEWABLE_TABLET | Freq: Every day | ORAL | 0 refills | Status: AC

## 2023-02-11 MED ORDER — DICYCLOMINE HCL 10 MG PO CAPS: 10.0000 mg | ORAL_CAPSULE | Freq: Three times a day (TID) | ORAL | 0 refills | Status: AC | PRN

## 2023-02-11 MED ORDER — ATORVASTATIN CALCIUM 80 MG PO TABS: 80.0000 mg | ORAL_TABLET | Freq: Every day | ORAL | 0 refills | Status: AC

## 2023-02-11 MED ORDER — SEMGLEE (YFGN) 100 UNIT/ML ~~LOC~~ SOPN: 30.0000 [IU] | PEN_INJECTOR | Freq: Every day | SUBCUTANEOUS | 0 refills | Status: AC

## 2023-02-11 MED ORDER — NITROGLYCERIN 0.4 MG SL SUBL: 0.4000 mg | SUBLINGUAL_TABLET | SUBLINGUAL | 0 refills | Status: AC | PRN

## 2023-02-11 MED ORDER — METOPROLOL SUCCINATE ER 25 MG PO TB24: 12.5000 mg | ORAL_TABLET | Freq: Every day | ORAL | 0 refills | Status: AC

## 2023-02-11 NOTE — Discharge Summary (Signed)
Physician Discharge Summary  Lauren Whitney VHQ:[id number] DOB: 08-23-1966 DOA: 02/09/2023  PCP: Jerl Mina, MD  Admit date: 02/09/2023 Discharge date: 02/11/2023  Admitted From: Home Disposition:  Home  Recommendations for Outpatient Follow-up:  Follow up with PCP in 1-2 weeks Follow-up with cardiology 1 to 2 weeks Follow-up with endocrinology 2 weeks  Home Health: No Equipment/Devices: None  Discharge Condition: Stable CODE STATUS: Full Diet recommendation: Heart healthy/carb modified  Brief/Interim Summary: 57 y.o. female with medical history significant of tobacco abuse 1 pack/day, T2DM, obesity BMI 31, HTN, GERD, previous right carotid stent who presented with chest pain.   Patient reports that she started to have chest pain around 10 AM this morning.  Her chest pain was located to midsternal area, pressure-like pain, intense and constant, moderate pain with unclear radiation.  Associated symptoms included nausea and diaphoresis.  Patient called EMS and was brought to [facility] ED for further evaluation.   In the ED, vitals showed T98 F, P75, RR 18, BP 98/78 and O2 sats 97% on room air.  Labs showed nonrevealing CMP, WBC 13.6, troponin 16.  EKG reviewed ST elevation in leads III and aVF.  She was urgently brought to cardiac catheterization laboratory by cardiologist.  Patient underwent urgent left heart catheterization.  Successful PCI with DES to mid and distal left circumflex.  PTCA of ostium of OM1.  Tolerated well.  No chest pain postprocedure  On day of discharge patient stable.  Chest pain free..  Blood pressure improved.  Echocardiogram read pending at time of this discharge.  Discussed with cardiology.  Okay for discharge.  Will follow-up outpatient 1 to 2 weeks.    Discharge Diagnoses:  Principal Problem:   STEMI (ST elevation myocardial infarction) (HCC) Active Problems:   Essential hypertension, benign   Diabetes (HCC)   Hyperlipidemia   Acute ST elevation  myocardial infarction (STEMI) of posterior wall (HCC)   Uncontrolled type 2 diabetes mellitus with hyperglycemia, with long-term current use of insulin (HCC)   Obesity (BMI 30-39.9)   Tobacco abuse   Leukocytosis  STEMI Status post left heart catheterization on 6/12.  Successful primary PCI with overlapping DES mid and distal left circumflex.  PTCA of ostium of OM1 Plan: Okay for discharge.  Dual antiplatelet therapy aspirin and Brilinta.  As aggressive risk factor modification.  Outpatient follow-up 2D echocardiogram.  Stable for discharge.  Follow-up outpatient Dr. Darrold Junker 1 to 2 weeks.  Type 2 diabetes mellitus with hyperglycemia Hemoglobin A1c greater than 12.  Poor control.  Diabetes coordinator consulted.  Recommendations appreciated.  At time of discharge recommend resumption of home metformin 2000 mg daily, Semglee 30 units nightly.  Follow-up outpatient endocrinology 1 to 2 weeks.   Essential hypertension Blood pressure improved.  On half dose metoprolol at time of discharge   Tobacco use Counseled on cessation.  Nicotine patches prescribed   Hyperlipidemia High intensity statin, prescribed on discharge  Discharge Instructions  Discharge Instructions     AMB Referral to Cardiac Rehabilitation - Phase II   Complete by: As directed    Diagnosis:  Coronary Stents STEMI     After initial evaluation and assessments completed: Virtual Based Care may be provided alone or in conjunction with Phase 2 Cardiac Rehab based on patient barriers.: Yes   Intensive Cardiac Rehabilitation (ICR) [facility] location only OR Traditional Cardiac Rehabilitation (TCR) *If criteria for ICR are not met will enroll in TCR [facility] only): Yes   Ambulatory Referral for Lung Cancer Scre   Complete by:  As directed    Diet - low sodium heart healthy   Complete by: As directed    Increase activity slowly   Complete by: As directed       Allergies as of 02/11/2023       Reactions   Keflex [cephalexin]  Hives, Itching, Dermatitis   Received Benadryl   Lisinopril Cough   Losartan Other (See Comments)   Blurred vision   Oxycodone-acetaminophen Other (See Comments)   Severe pain while taking         Medication List     STOP taking these medications    telmisartan 40 MG tablet Commonly known as: MICARDIS       TAKE these medications    acetaminophen 500 MG tablet Commonly known as: TYLENOL Take 1,000 mg by mouth every 6 (six) hours as needed (for pain.).   aspirin 81 MG chewable tablet Chew 1 tablet (81 mg total) by mouth daily.   atorvastatin 80 MG tablet Commonly known as: LIPITOR Take 1 tablet (80 mg total) by mouth daily. What changed:  medication strength how much to take   Blood Glucose Monitoring Suppl Kit 1 kit by Does not apply route daily as needed.   dicyclomine 10 MG capsule Commonly known as: BENTYL Take 1 capsule (10 mg total) by mouth 3 (three) times daily as needed for spasms.   FreeStyle Libre 2 Sensor Misc USE 1 KIT EVERY 14 (FOURTEEN) DAYS FOR GLUCOSE MONITORING   metFORMIN 500 MG 24 hr tablet Commonly known as: GLUCOPHAGE-XR Take 2,000 mg by mouth every evening.   metoprolol succinate 25 MG 24 hr tablet Commonly known as: TOPROL-XL Take 0.5 tablets (12.5 mg total) by mouth daily.   nicotine 14 mg/24hr patch Commonly known as: NICODERM CQ - dosed in mg/24 hours Place 1 patch (14 mg total) onto the skin daily.   nitroGLYCERIN 0.4 MG SL tablet Commonly known as: NITROSTAT Place 1 tablet (0.4 mg total) under the tongue every 5 (five) minutes x 3 doses as needed for chest pain.   Semglee (yfgn) 100 UNIT/ML Pen Generic drug: insulin glargine-yfgn Inject 30 Units into the skin at bedtime. What changed: how much to take   ticagrelor 90 MG Tabs tablet Commonly known as: BRILINTA Take 1 tablet (90 mg total) by mouth 2 (two) times daily.        Follow-up Information     Paraschos, Alexander, MD. Go in 1 week(s).   Specialty:  Cardiology Why: Appointment on Monday, 02/21/2023 at 2:45pm. Contact information: [address] [facility] [place] [address] [phone]         Jerl Mina, MD. Schedule an appointment as soon as possible for a visit in 1 week(s).   Specialty: Family Medicine Contact information: [address] [facility] [place] [address] [phone]         Raj Janus, MD. Schedule an appointment as soon as possible for a visit in 2 week(s).   Specialty: Endocrinology Why: Please call and schedule this appointment Contact information: [address] [facility] [place] [address] [phone]                Allergies  Allergen Reactions   Keflex [Cephalexin] Hives, Itching and Dermatitis    Received Benadryl   Lisinopril Cough   Losartan Other (See Comments)    Blurred vision   Oxycodone-Acetaminophen Other (See Comments)    Severe pain while taking     Consultations: Cardiology  Procedures/Studies: CARDIAC CATHETERIZATION  Result Date: 02/09/2023   Prox Cx to Mid Cx lesion is 100% stenosed.   Mid RCA lesion is 75% stenosed.   Dist Cx lesion is 90% stenosed.   1st Mrg lesion is 80% stenosed.   A drug-eluting stent was successfully placed using a STENT ONYX FRONTIER [id number].   A drug-eluting stent was successfully placed using a STENT ONYX FRONTIER 2.5X22.   Balloon angioplasty was performed using a BALLN MINITREK RX 2.0X12.   Post intervention, there is a 0% residual stenosis.   Post intervention, there is a 0% residual stenosis.   Post intervention, there is a 10% residual stenosis.   There is mild left ventricular systolic dysfunction.   The left ventricular ejection fraction is 45-50% by visual estimate. 1.  Posterior STEMI 2.  2 vessel coronary artery disease with occluded mid left circumflex (culprit vessel), 80% stenosis ostial OM1, 75% stenosis mid RCA 3.  Reduced left ventricular function with  posterior hypokinesis 4.  Successful primary PCI with 0.25 x 22 mm Onyx Frontier DES mid/distal left circumflex, 2.75 x 18 mm Onyx Frontier DES mid left circumflex, and balloon angioplasty ostium OM1 Recommendations 1.  Dual antiplatelet therapy uninterrupted x 1 year 2.  2D echocardiogram 3.  Aggressive risk factor modification   DG Chest Portable 1 View  Result Date: 02/09/2023 CLINICAL DATA:  Chest pain. EXAM: PORTABLE CHEST 1 VIEW COMPARISON:  None Available. FINDINGS: Slight underinflation. Bronchovascular crowding. Normal cardiopericardial silhouette. Slight prominence of the central vasculature. No pneumothorax, effusion or consolidation. Overlapping cardiac leads and defibrillator pads. Degenerative changes seen of the spine. IMPRESSION: Underinflation. Slight central vascular congestion and bronchovascular crowding Electronically Signed   By: Karen Kays M.D.   On: 02/09/2023 14:00      Subjective: Seen and examined on the day of discharge.  Stable no distress.  Chest pain-free.  Counseled on glycemic control and tobacco cessation.  Discharge Exam: Vitals:   02/11/23 0413 02/11/23 0842  BP: 120/68 115/69  Pulse: 66 68  Resp: 18 18  Temp: 98.7 F (37.1 C) 98.8 F (37.1 C)  SpO2: 96% 98%   Vitals:   02/10/23 2000 02/10/23 2041 02/11/23 0413 02/11/23 0842  BP: 124/66 128/70 120/68 115/69  Pulse: 60 61 66 68  Resp: (!) 26 18 18 18   Temp: 98.8 F (37.1 C) 98 F (36.7 C) 98.7 F (37.1 C) 98.8 F (37.1 C)  TempSrc: Oral   Oral  SpO2: 98% 97% 96% 98%  Weight:   87.5 kg   Height:        General: Pt is alert, awake, not in acute distress Cardiovascular: RRR, S1/S2 +, no rubs, no gallops Respiratory: CTA bilaterally, no wheezing, no rhonchi Abdominal: Soft, NT, ND, bowel sounds + Extremities: no edema, no cyanosis    The results of significant diagnostics from this hospitalization (including imaging, microbiology, ancillary and laboratory) are listed below for reference.      Microbiology: Recent Results (from the past 240 hour(s))  MRSA Next Gen by PCR, Nasal     Status: None   Collection Time: 02/09/23  4:08 PM   Specimen: Nasal Mucosa; Nasal Swab  Result Value Ref Range Status   MRSA by PCR Next Gen NOT DETECTED NOT DETECTED Final    Comment: (NOTE) The GeneXpert MRSA Assay (FDA approved for NASAL specimens only), is one component of a comprehensive MRSA colonization surveillance program. It is not intended to diagnose MRSA infection nor to guide or monitor treatment for MRSA  infections. Test performance is not FDA approved in patients less than 45 years old. Performed at [facility], [address] [place], [place] [address]      Labs: BNP (last 3 results) No results for input(s): "BNP" in the last 8760 hours. Basic Metabolic Panel: Recent Labs  Lab 02/09/23 1259 02/10/23 0359  NA 135 134*  K 3.7 4.0  CL 101 105  CO2 21* 19*  GLUCOSE 324* 314*  BUN 10 14  CREATININE 0.73 0.59  CALCIUM 8.6* 8.1*   Liver Function Tests: Recent Labs  Lab 02/09/23 1259  AST 21  ALT 19  ALKPHOS 85  BILITOT 0.7  PROT 6.6  ALBUMIN 3.6   No results for input(s): "LIPASE", "AMYLASE" in the last 168 hours. No results for input(s): "AMMONIA" in the last 168 hours. CBC: Recent Labs  Lab 02/09/23 1259 02/10/23 0359  WBC 13.6* 13.2*  NEUTROABS 6.2  --   HGB 14.4 12.5  HCT 44.7 38.7  MCV 86.5 87.0  PLT 366 324   Cardiac Enzymes: No results for input(s): "CKTOTAL", "CKMB", "CKMBINDEX", "TROPONINI" in the last 168 hours. BNP: Invalid input(s): "POCBNP" CBG: Recent Labs  Lab 02/10/23 0722 02/10/23 1115 02/10/23 1647 02/10/23 2148 02/11/23 0838  GLUCAP 308* 299* 233* 213* 244*   D-Dimer No results for input(s): "DDIMER" in the last 72 hours. Hgb A1c Recent Labs    02/09/23 1259  HGBA1C 12.6*   Lipid Profile Recent Labs    02/09/23 1259  CHOL 192  HDL 34*  LDLCALC 121*  TRIG 187*  CHOLHDL 5.6   Thyroid function  studies Recent Labs    02/10/23 0359  TSH 0.731   Anemia work up No results for input(s): "VITAMINB12", "FOLATE", "FERRITIN", "TIBC", "IRON", "RETICCTPCT" in the last 72 hours. Urinalysis    Component Value Date/Time   COLORURINE YELLOW (A) 04/07/2021 0840   APPEARANCEUR HAZY (A) 04/07/2021 0840   LABSPEC 1.020 04/07/2021 0840   PHURINE 6.0 04/07/2021 0840   GLUCOSEU >=500 (A) 04/07/2021 0840   HGBUR NEGATIVE 04/07/2021 0840   BILIRUBINUR NEGATIVE 04/07/2021 0840   KETONESUR NEGATIVE 04/07/2021 0840   PROTEINUR 30 (A) 04/07/2021 0840   NITRITE NEGATIVE 04/07/2021 0840   LEUKOCYTESUR NEGATIVE 04/07/2021 0840   Sepsis Labs Recent Labs  Lab 02/09/23 1259 02/10/23 0359  WBC 13.6* 13.2*   Microbiology Recent Results (from the past 240 hour(s))  MRSA Next Gen by PCR, Nasal     Status: None   Collection Time: 02/09/23  4:08 PM   Specimen: Nasal Mucosa; Nasal Swab  Result Value Ref Range Status   MRSA by PCR Next Gen NOT DETECTED NOT DETECTED Final    Comment: (NOTE) The GeneXpert MRSA Assay (FDA approved for NASAL specimens only), is one component of a comprehensive MRSA colonization surveillance program. It is not intended to diagnose MRSA infection nor to guide or monitor treatment for MRSA infections. Test performance is not FDA approved in patients less than 58 years old. Performed at [facility], [address], [place], [place] [address]      Time coordinating discharge: Over 30 minutes  SIGNED:   Tresa Moore, MD  [facility] 02/11/2023, 11:48 AM Pager   If 7PM-7AM, please contact night-coverage

## 2023-02-11 NOTE — Progress Notes (Signed)
[facility] Cardiology  SUBJECTIVE: Patient sitting on side of the bed, reports doing well, denies chest pain or shortness of breath   Vitals:   02/10/23 1725 02/10/23 2000 02/10/23 2041 02/11/23 0413  BP: 117/71 124/66 128/70 120/68  Pulse: 64 60 61 66  Resp: (!) 21 (!) 26 18 18   Temp: 98.9 F (37.2 C) 98.8 F (37.1 C) 98 F (36.7 C) 98.7 F (37.1 C)  TempSrc:  Oral    SpO2: 98% 98% 97% 96%  Weight:    87.5 kg  Height:         Intake/Output Summary (Last 24 hours) at 02/11/2023 0817 Last data filed at 02/10/2023 1924 Gross per 24 hour  Intake 1200 ml  Output --  Net 1200 ml      PHYSICAL EXAM  General: Well developed, well nourished, in no acute distress HEENT:  Normocephalic and atramatic Neck:  No JVD.  Lungs: Clear bilaterally to auscultation and percussion. Heart: HRRR . Normal S1 and S2 without gallops or murmurs.  Abdomen: Bowel sounds are positive, abdomen soft and non-tender  Msk:  Back normal, normal gait. Normal strength and tone for age. Extremities: No clubbing, cyanosis or edema.   Neuro: Alert and oriented X 3. Psych:  Good affect, responds appropriately   LABS: Basic Metabolic Panel: Recent Labs    02/09/23 1259 02/10/23 0359  NA 135 134*  K 3.7 4.0  CL 101 105  CO2 21* 19*  GLUCOSE 324* 314*  BUN 10 14  CREATININE 0.73 0.59  CALCIUM 8.6* 8.1*   Liver Function Tests: Recent Labs    02/09/23 1259  AST 21  ALT 19  ALKPHOS 85  BILITOT 0.7  PROT 6.6  ALBUMIN 3.6   No results for input(s): "LIPASE", "AMYLASE" in the last 72 hours. CBC: Recent Labs    02/09/23 1259 02/10/23 0359  WBC 13.6* 13.2*  NEUTROABS 6.2  --   HGB 14.4 12.5  HCT 44.7 38.7  MCV 86.5 87.0  PLT 366 324   Cardiac Enzymes: No results for input(s): "CKTOTAL", "CKMB", "CKMBINDEX", "TROPONINI" in the last 72 hours. BNP: Invalid input(s): "POCBNP" D-Dimer: No results for input(s): "DDIMER" in the last 72 hours. Hemoglobin A1C: Recent Labs    02/09/23 1259   HGBA1C 12.6*   Fasting Lipid Panel: Recent Labs    02/09/23 1259  CHOL 192  HDL 34*  LDLCALC 121*  TRIG 187*  CHOLHDL 5.6   Thyroid Function Tests: Recent Labs    02/10/23 0359  TSH 0.731   Anemia Panel: No results for input(s): "VITAMINB12", "FOLATE", "FERRITIN", "TIBC", "IRON", "RETICCTPCT" in the last 72 hours.  CARDIAC CATHETERIZATION  Result Date: 02/09/2023   Prox Cx to Mid Cx lesion is 100% stenosed.   Mid RCA lesion is 75% stenosed.   Dist Cx lesion is 90% stenosed.   1st Mrg lesion is 80% stenosed.   A drug-eluting stent was successfully placed using a STENT ONYX FRONTIER [id number].   A drug-eluting stent was successfully placed using a STENT ONYX FRONTIER 2.5X22.   Balloon angioplasty was performed using a BALLN MINITREK RX 2.0X12.   Post intervention, there is a 0% residual stenosis.   Post intervention, there is a 0% residual stenosis.   Post intervention, there is a 10% residual stenosis.   There is mild left ventricular systolic dysfunction.   The left ventricular ejection fraction is 45-50% by visual estimate. 1.  Posterior STEMI 2.  2 vessel coronary artery disease with occluded mid left circumflex (culprit  vessel), 80% stenosis ostial OM1, 75% stenosis mid RCA 3.  Reduced left ventricular function with posterior hypokinesis 4.  Successful primary PCI with 0.25 x 22 mm Onyx Frontier DES mid/distal left circumflex, 2.75 x 18 mm Onyx Frontier DES mid left circumflex, and balloon angioplasty ostium OM1 Recommendations 1.  Dual antiplatelet therapy uninterrupted x 1 year 2.  2D echocardiogram 3.  Aggressive risk factor modification   DG Chest Portable 1 View  Result Date: 02/09/2023 CLINICAL DATA:  Chest pain. EXAM: PORTABLE CHEST 1 VIEW COMPARISON:  None Available. FINDINGS: Slight underinflation. Bronchovascular crowding. Normal cardiopericardial silhouette. Slight prominence of the central vasculature. No pneumothorax, effusion or consolidation. Overlapping cardiac leads and  defibrillator pads. Degenerative changes seen of the spine. IMPRESSION: Underinflation. Slight central vascular congestion and bronchovascular crowding Electronically Signed   By: Karen Kays M.D.   On: 02/09/2023 14:00     Echo pending  TELEMETRY: Sinus rhythm 61 bpm:  ASSESSMENT AND PLAN:  Principal Problem:   STEMI (ST elevation myocardial infarction) (HCC) Active Problems:   Essential hypertension, benign   Diabetes (HCC)   Hyperlipidemia   Acute ST elevation myocardial infarction (STEMI) of posterior wall (HCC)   Uncontrolled type 2 diabetes mellitus with hyperglycemia, with long-term current use of insulin (HCC)   Obesity (BMI 30-39.9)   Tobacco abuse   Leukocytosis    1. Posterior STEMI, with acute occlusion of mid left circumflex, status post successful primary PCI with overlapping DES mid and distal left circumflex, and PTCA of ostium of OM1, with preserved left ventricular function with LVEF 45 to 50% with posterior wall hypokinesis 2.  Type 2 diabetes, poorly controlled. A1c 12.6% 3.  Tobacco abuse, current 1PPD, motivated to quit by using patches vs cold [place]  Recommendations  1.  Agree with current therapy 2.  Continue dual antiplatelet therapy uninterrupted x 1 year 3.  Continue high intensity atorvastatin 80 mg daily 4.  Continue low-dose metoprolol succinate 12.5 mg daily 5.  Strongly advised patient to stop smoking 6.  Cardiac rehabilitation 7.  Follow-up with me 1 to 2 weeks   Marcina Millard, MD, PhD, [facility] 02/11/2023 8:17 AM

## 2023-02-11 NOTE — Progress Notes (Signed)
Transition of Care [facility]) - Inpatient Brief Assessment   Patient Details  Name: Lauren Whitney MRN: [id number] Date of Birth: [birth year]  Transition [facility] CM/SW Contact:    Truddie Hidden, RN Phone Number: 02/11/2023, 9:57 AM   Clinical Narrative: TOC continues to provide ongoing  assessment for needs and discharge planning.   Transition of Care Asessment: Insurance and Status: Insurance coverage has been reviewed Patient has primary care physician: Yes Home environment has been reviewed: return to home Prior level of function:: indpendent Prior/Current Home Services: No current home services Social Determinants of Health Reivew: SDOH reviewed no interventions necessary Readmission risk has been reviewed: Yes Transition of care needs: no transition of care needs at this time

## 2023-02-11 NOTE — Inpatient Diabetes Management (Signed)
Inpatient Diabetes Program Recommendations  AACE/ADA: New Consensus Statement on Inpatient Glycemic Control (2015)  Target Ranges:  Prepandial:   less than 140 mg/dL      Peak postprandial:   less than 180 mg/dL (1-2 hours)      Critically ill patients:  140 - 180 mg/dL    Latest Reference Range & Units 02/10/23 07:22 02/10/23 11:15 02/10/23 16:47 02/10/23 21:48  Glucose-Capillary 70 - 99 mg/dL 161 (H)  7 units Novolog @0858  299 (H)  5 units Novolog @1218  233 (H)  5 units Novolog  213 (H)    20 units Levemir @2209   (H): Data is abnormally high  Latest Reference Range & Units 02/11/23 08:38  Glucose-Capillary 70 - 99 mg/dL 096 (H)  (H): Data is abnormally high   Admit with: STEMI   History: DM2   Home DM Meds: Freestyle 2 CGM                              Metformin 2000 mg QPM                              Semglee 40 units QHS   Current Orders: Levemir 20 units QHS                            Novolog Sensitive Correction Scale/ SSI (0-9 units) TID AC      ENDO: Dr. Tedd Sias Last Seen 06/28/2022--A1c at that visit was 7.8%   MD- When pt discharges home, recommend:  1. Resume Metformin 2000 mg QPM  2. Adjust home Semglee Insulin to 30 units QHS  3. Follow up with ENDO for further insulin adjustment   --Will follow patient during hospitalization--  Ambrose Finland RN, MSN, CDCES Diabetes Coordinator Inpatient Glycemic Control Team Team Pager: [phone] (8a-5p)

## 2023-02-17 ENCOUNTER — Other Ambulatory Visit (INDEPENDENT_AMBULATORY_CARE_PROVIDER_SITE_OTHER): Payer: Self-pay | Admitting: Vascular Surgery

## 2023-02-17 NOTE — Telephone Encounter (Signed)
Patient was taken off this medication on 12/28/22

## 2023-02-25 ENCOUNTER — Telehealth (INDEPENDENT_AMBULATORY_CARE_PROVIDER_SITE_OTHER): Payer: Self-pay | Admitting: Nurse Practitioner

## 2023-02-25 NOTE — Telephone Encounter (Signed)
Patient called in requesting an appointment for post op visit. Talked with NP Sheppard Plumber and she confirmed patient did not need to schedule f/u with this office she needed to schedule with cardio. Gave patient the MD name and office number. Patient stated understanding and told her she could call us back if she needed [place]

## 2023-03-06 IMAGING — CT CT NECK W/ CM
4 of 5 series · 14 of 34 positions shown, 16 images · IV contrast (agent unspecified)
Comparison: CT head/cervical spine 11/22/2009.

CLINICAL DATA: Provided history: Soft tissue swelling. Additional
history provided: Patient reports "knot" on back of head, worsening.

EXAM:
CT NECK WITH CONTRAST
TECHNIQUE: Multidetector CT imaging of the neck was performed using the
standard protocol following the bolus administration of intravenous
contrast.

[Series 4: axial bone · axial · 0.55mm/px · z∈[+320,+426]mm · 3 of 107 slices shown]
[im 27/107  bone]
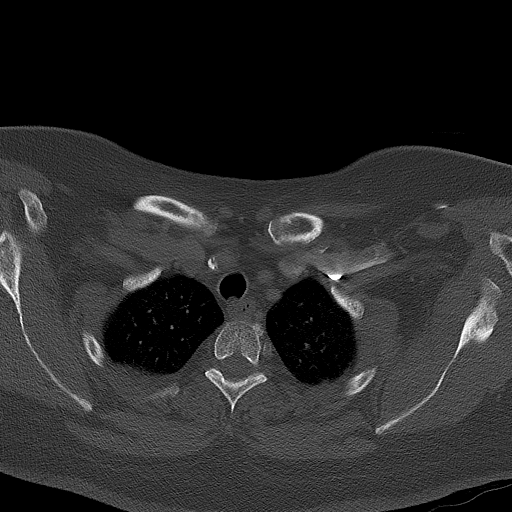
[im 54/107  bone]
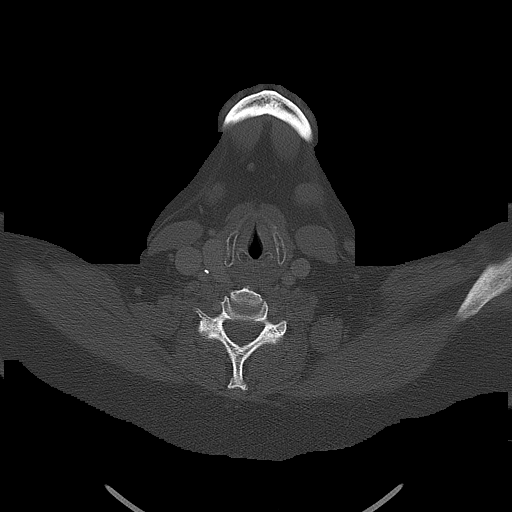
[im 80/107  bone]
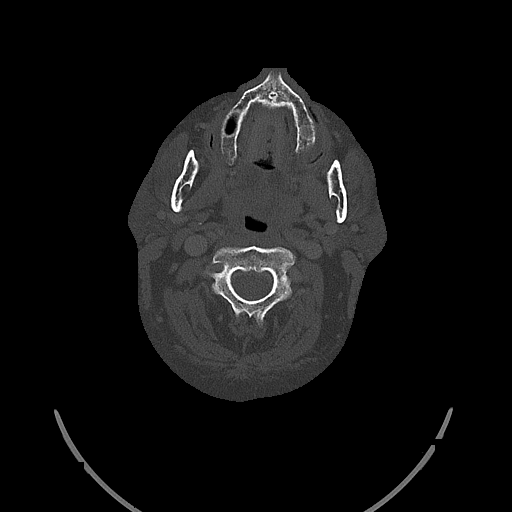

[Series 5: sag neck · sagittal · 0.45mm/px · 5 of 80 slices shown, 6 images]
[im 27/80  bone]
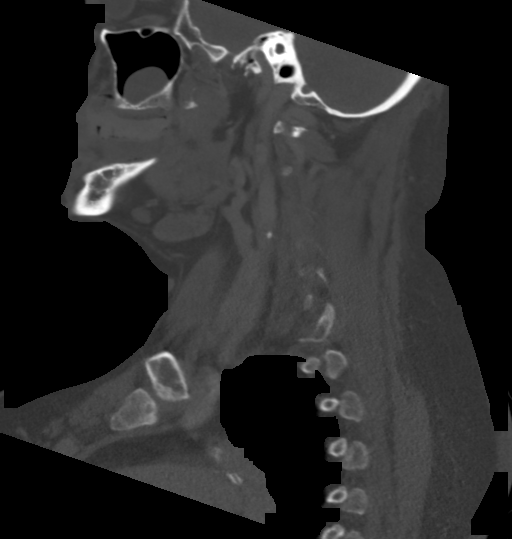
[im 33/80  bone]
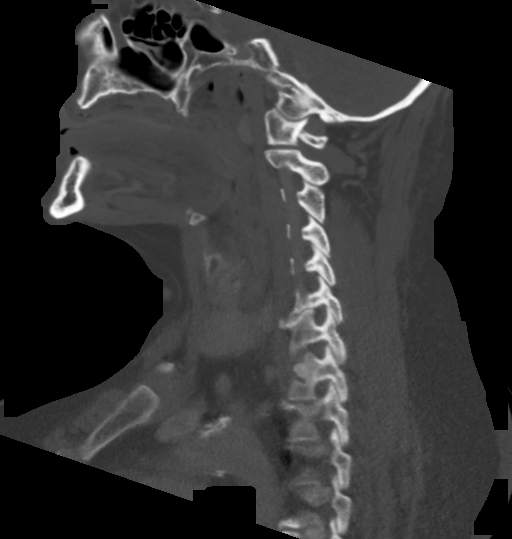
[im 40/80  soft-tissue]
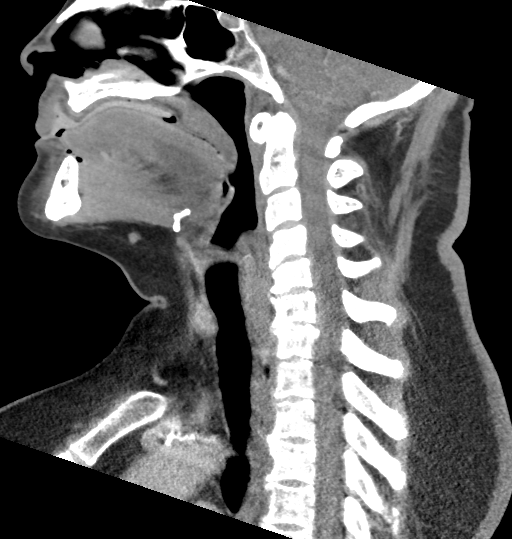
[im 40/80  bone]
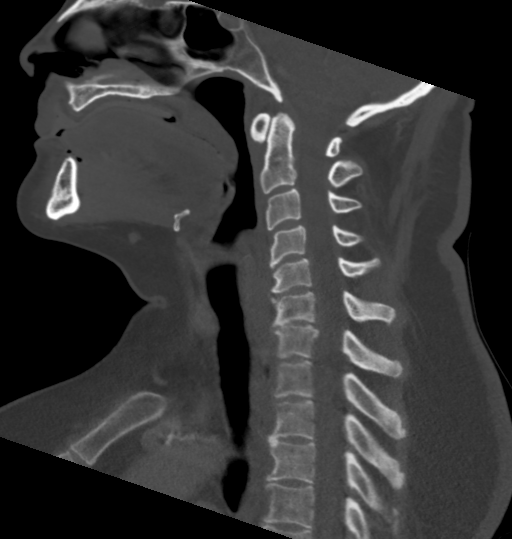
[im 47/80  bone]
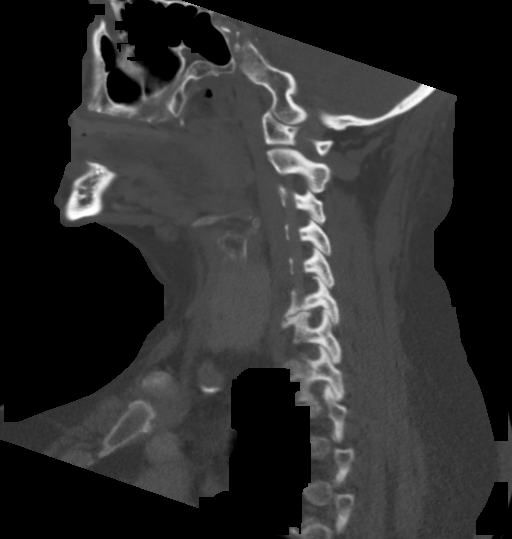
[im 53/80  bone]
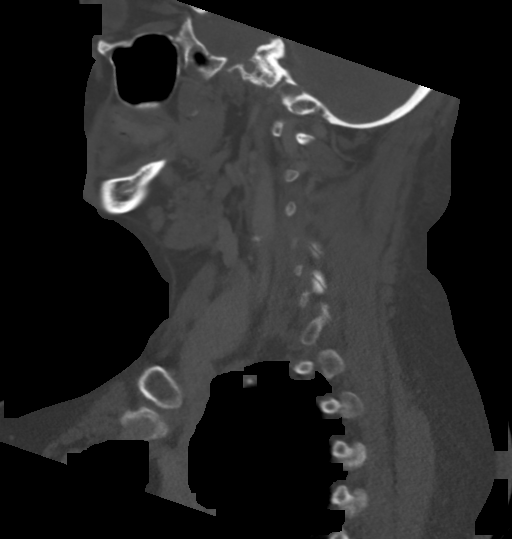

[Series 6: cor neck · coronal · 0.43mm/px · 3 of 114 slices shown]
[im 33/114  bone]
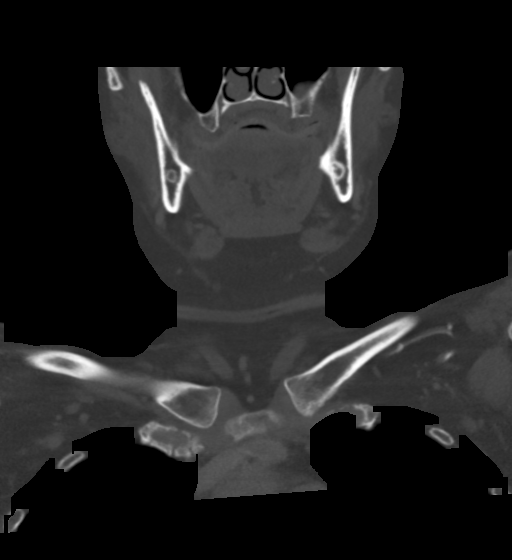
[im 49/114  bone]
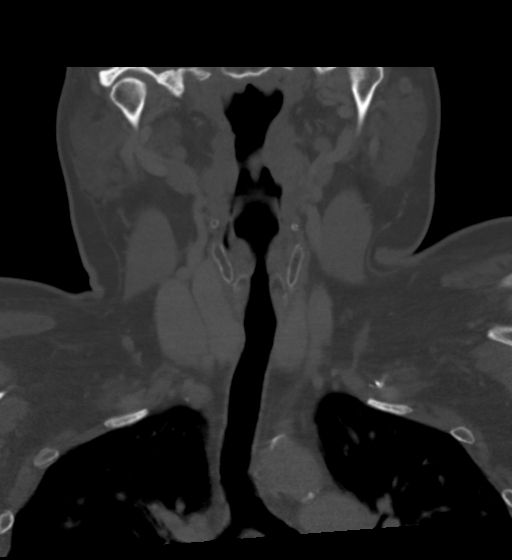
[im 65/114  bone]
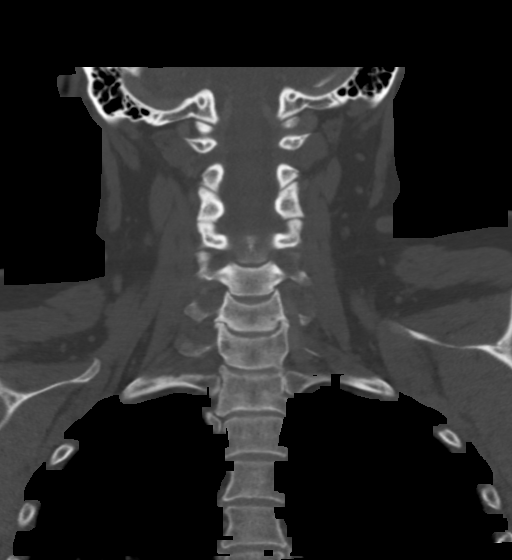

[Series 7: orthogonal (person_name) · axial · 0.44mm/px · z∈[+296,+407]mm · 3 of 117 slices shown, 4 images]
[im 30/117  soft-tissue]
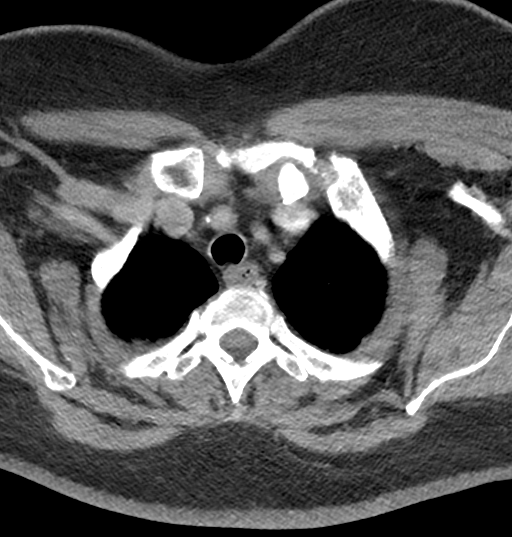
[im 30/117  bone]
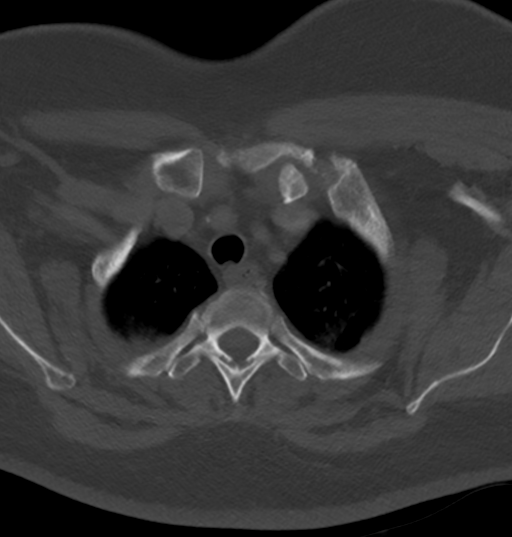
[im 59/117  bone]
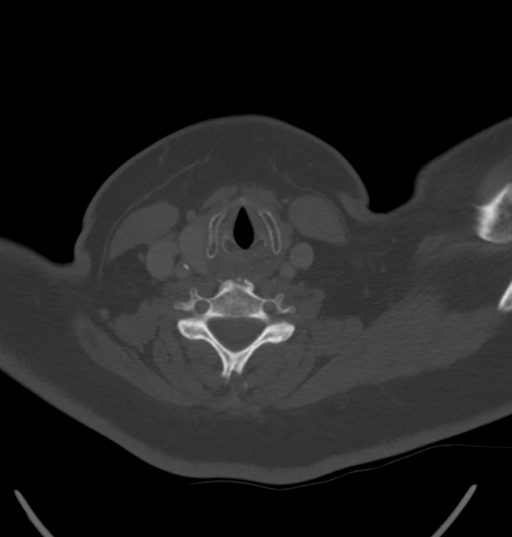
[im 88/117  bone]
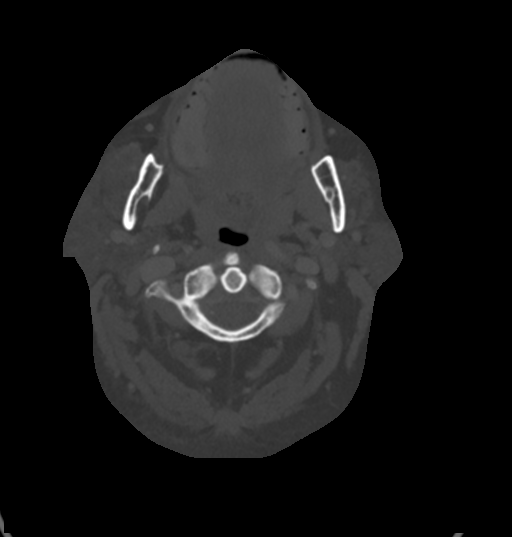

[14 of 34 positions shown; findings below may reference images not displayed]

RADIATION DOSE REDUCTION: This exam was performed according to the
departmental dose-optimization program which includes automated
exposure control, adjustment of the mA and/or kV according to
patient size and/or use of iterative reconstruction technique.

CONTRAST:  75mL OMNIPAQUE IOHEXOL 300 MG/ML  SOLN
FINDINGS: Pharynx and larynx: The patient is edentulous. No appreciable
swelling or mass within the oral cavity, pharynx or larynx.

Salivary glands: No inflammation, mass, or stone.

Thyroid: Subcentimeter nodules within the bilateral thyroid lobes,
not meeting consensus criteria for ultrasound follow-up based on
size.

Lymph nodes: No pathologically enlarged lymph nodes are identified
within the neck.

Vascular: The major vascular structures of the neck are patent.
Atherosclerotic plaque within the visualized aortic arch, proximal
major branch vessels of the neck and carotid arteries.
Atherosclerotic plaque about the right carotid bifurcation could
result in a hemodynamically significant stenosis at the origin of
the right ICA.

Limited intracranial: No acute finding within the field of view.

Visualized orbits: No orbital mass or acute orbital finding at the
imaged levels.

Mastoids and visualized paranasal sinuses: 2.1 cm mucous retention
cyst within the left maxillary sinus. Small-volume fluid within the
right mastoid air cells.

Skeleton: Straightening of the expected cervical lordosis. Cervical
spondylosis. Most notably at C6-C7, there is advanced disc space
narrowing with a disc bulge, endplate spurring and bilateral
uncovertebral hypertrophy. Right greater than left bony neural
foraminal narrowing and apparent mild-to-moderate spinal canal
stenosis at this level. No aggressive osseous lesion.

Upper chest: No consolidation within the imaged lung apices.

Other: Incompletely imaged irregular region of soft tissue
induration with surrounding inflammatory stranding within the right
occipital scalp. No abscess at the imaged levels.
IMPRESSION: 1. Incompletely imaged irregular region of soft tissue induration
with surrounding inflammatory stranding in the right occipital
scalp. Findings likely reflect soft tissue infection. No abscess is
identified at the imaged levels.
2. 2.1 cm mucous retention cyst within the left maxillary sinus.
3. Small-volume fluid within the right mastoid air cells.
4. Atherosclerotic plaque within the visualized aortic arch,
proximal major branch vessels of the neck and carotid arteries.
Atherosclerotic plaque about the right carotid bifurcation could
result in a hemodynamically significant stenosis at the origin of
the right internal carotid artery. A nonemergent carotid artery
duplex should be considered for further evaluation. Aortic
Atherosclerosis ([id number]).
5. Cervical spondylosis, greatest at C6-C7.

## 2023-03-10 ENCOUNTER — Encounter: Payer: Managed Care, Other (non HMO) | Attending: Cardiology | Admitting: *Deleted

## 2023-03-10 ENCOUNTER — Encounter: Payer: Self-pay | Admitting: *Deleted

## 2023-03-10 DIAGNOSIS — Z955 Presence of coronary angioplasty implant and graft: Secondary | ICD-10-CM | POA: Insufficient documentation

## 2023-03-10 DIAGNOSIS — I213 ST elevation (STEMI) myocardial infarction of unspecified site: Secondary | ICD-10-CM | POA: Insufficient documentation

## 2023-03-10 NOTE — Progress Notes (Signed)
Initial phone call completed. Dx can be found in [facility] 6/12. EP orientation scheduled for 7/29 at 3:00 pm.

## 2023-03-17 ENCOUNTER — Other Ambulatory Visit: Payer: Self-pay | Admitting: Family Medicine

## 2023-03-17 DIAGNOSIS — Z1231 Encounter for screening mammogram for malignant neoplasm of breast: Secondary | ICD-10-CM

## 2023-03-28 ENCOUNTER — Encounter: Payer: Managed Care, Other (non HMO) | Admitting: *Deleted

## 2023-03-28 VITALS — Ht 65.5 in | Wt 191.9 lb

## 2023-03-28 DIAGNOSIS — I213 ST elevation (STEMI) myocardial infarction of unspecified site: Secondary | ICD-10-CM

## 2023-03-28 DIAGNOSIS — Z955 Presence of coronary angioplasty implant and graft: Secondary | ICD-10-CM

## 2023-03-28 NOTE — Progress Notes (Signed)
Cardiac Individual Treatment Plan  Patient Details  Name: Lauren Whitney MRN: [id number] Date of Birth: 09-12-1965 Referring Provider:   Flowsheet Row Cardiac Rehab from 03/28/2023 in [facility] Cardiac and Pulmonary Rehab  Referring Provider Paraschos       Initial Encounter Date:  Flowsheet Row Cardiac Rehab from 03/28/2023 in [facility] Cardiac and Pulmonary Rehab  Date 03/28/23       Visit Diagnosis: ST elevation myocardial infarction (STEMI), unspecified artery [facility])  Status post coronary artery stent placement  Patient's Home Medications on Admission:  Current Outpatient Medications:    acetaminophen (TYLENOL) 500 MG tablet, Take 1,000 mg by mouth every 6 (six) hours as needed (for pain.)., Disp: , Rfl:    atorvastatin (LIPITOR) 80 MG tablet, Take 1 tablet (80 mg total) by mouth daily., Disp: 30 tablet, Rfl: 0   Blood Glucose Monitoring Suppl KIT, 1 kit by Does not apply route daily as needed., Disp: 1 kit, Rfl: 0   Continuous Blood Gluc Sensor (FREESTYLE LIBRE 2 SENSOR) MISC, USE 1 KIT EVERY 14 (FOURTEEN) DAYS FOR GLUCOSE MONITORING, Disp: , Rfl:    dicyclomine (BENTYL) 10 MG capsule, Take 1 capsule (10 mg total) by mouth 3 (three) times daily as needed for spasms., Disp: 30 capsule, Rfl: 0   metFORMIN (GLUCOPHAGE-XR) 500 MG 24 hr tablet, Take 2,000 mg by mouth every evening., Disp: , Rfl:    metoprolol succinate (TOPROL-XL) 25 MG 24 hr tablet, Take 0.5 tablets (12.5 mg total) by mouth daily., Disp: 15 tablet, Rfl: 0   nicotine (NICODERM CQ - DOSED IN MG/24 HOURS) 14 mg/24hr patch, Place 1 patch (14 mg total) onto the skin daily., Disp: 28 patch, Rfl: 0   nitroGLYCERIN (NITROSTAT) 0.4 MG SL tablet, Place 1 tablet (0.4 mg total) under the tongue every 5 (five) minutes x 3 doses as needed for chest pain., Disp: 100 tablet, Rfl: 0  Past Medical History: Past Medical History:  Diagnosis Date   Cholelithiases    Diabetes (HCC)    GERD (gastroesophageal reflux disease)    due to  gallbladder-no meds   Hypertension    off meds x 2 years-better control now    Tobacco Use: Social History   Tobacco Use  Smoking Status Every Day   Current packs/day: 1.00   Average packs/day: 1 pack/day for 30.0 years (30.0 ttl pk-yrs)   Types: Cigarettes  Smokeless Tobacco Never    Labs: Review Flowsheet       Latest Ref Rng & Units 04/07/2021 06/07/2022 02/09/2023  Labs for ITP Cardiac and Pulmonary Rehab  Cholestrol 0 - 200 mg/dL - - 253   LDL (calc) 0 - 99 mg/dL - - 664   HDL-C >40 mg/dL - - 34   Trlycerides <347 mg/dL - - 425   Hemoglobin Z5G 4.8 - 5.6 % 10.8  8.1  12.6     Details             Exercise Target Goals: Exercise Program Goal: Individual exercise prescription set using results from initial 6 min walk test and THRR while considering  patient's activity barriers and safety.   Exercise Prescription Goal: Initial exercise prescription builds to 30-45 minutes a day of aerobic activity, 2-3 days per week.  Home exercise guidelines will be given to patient during program as part of exercise prescription that the participant will acknowledge.   Education: Aerobic Exercise: - Group verbal and visual presentation on the components of exercise prescription. Introduces F.I.T.T principle from ACSM for exercise prescriptions.  Reviews F.I.T.T. principles of aerobic exercise including progression. Written material given at graduation. Flowsheet Row Cardiac Rehab from 03/28/2023 in [facility] Cardiac and Pulmonary Rehab  Education need identified 03/28/23       Education: Resistance Exercise: - Group verbal and visual presentation on the components of exercise prescription. Introduces F.I.T.T principle from ACSM for exercise prescriptions  Reviews F.I.T.T. principles of resistance exercise including progression. Written material given at graduation.    Education: Exercise & Equipment Safety: - Individual verbal instruction and demonstration of equipment use and safety  with use of the equipment. Flowsheet Row Cardiac Rehab from 03/28/2023 in [facility] Cardiac and Pulmonary Rehab  Date 03/28/23  Educator [facility]  Instruction Review Code 1- Verbalizes Understanding       Education: Exercise Physiology & General Exercise Guidelines: - Group verbal and written instruction with models to review the exercise physiology of the cardiovascular system and associated critical values. Provides general exercise guidelines with specific guidelines to those with heart or lung disease.  Flowsheet Row Cardiac Rehab from 03/28/2023 in [facility] Cardiac and Pulmonary Rehab  Education need identified 03/28/23       Education: Flexibility, Balance, Mind/Body Relaxation: - Group verbal and visual presentation with interactive activity on the components of exercise prescription. Introduces F.I.T.T principle from ACSM for exercise prescriptions. Reviews F.I.T.T. principles of flexibility and balance exercise training including progression. Also discusses the mind body connection.  Reviews various relaxation techniques to help reduce and manage stress (i.e. Deep breathing, progressive muscle relaxation, and visualization). Balance handout provided to take home. Written material given at graduation.   Activity Barriers & Risk Stratification:  Activity Barriers & Cardiac Risk Stratification - 03/28/23 1715       Activity Barriers & Cardiac Risk Stratification   Activity Barriers Other (comment)    Comments Neuropathy    Cardiac Risk Stratification High             6 Minute Walk:  6 Minute Walk     Row Name 03/28/23 1714         6 Minute Walk   Phase Initial     Distance 1245 feet     Walk Time 6 minutes     # of Rest Breaks 0     MPH 2.36     METS 3.3     RPE 11     Perceived Dyspnea  0     VO2 Peak 11.57     Symptoms No     Resting HR 74 bpm     Resting BP 122/80     Resting Oxygen Saturation  96 %     Exercise Oxygen Saturation  during 6 min walk 98 %     Max Ex.  HR 105 bpm     Max Ex. BP 130/70     2 Minute Post BP 132/80              Oxygen Initial Assessment:   Oxygen Re-Evaluation:   Oxygen Discharge (Final Oxygen Re-Evaluation):   Initial Exercise Prescription:  Initial Exercise Prescription - 03/28/23 1700       Date of Initial Exercise RX and Referring Provider   Date 03/28/23    Referring Provider Paraschos      Oxygen   Maintain Oxygen Saturation 88% or higher      Treadmill   MPH 2.5    Grade 1    Minutes 15    METs 3.26      Recumbant Bike   Level  2    RPM 50    Watts 20    Minutes 15      Elliptical   Level 1    Speed 3    Minutes 15    METs 3.3      REL-XR   Level 2    Speed 50    Minutes 15    METs 3.3      Track   Laps 42    Minutes 15    METs 3.28      Prescription Details   Frequency (times per week) 3    Duration Progress to 30 minutes of continuous aerobic without signs/symptoms of physical distress      Intensity   THRR 40-80% of Max Heartrate 109-145    Ratings of Perceived Exertion 11-13    Perceived Dyspnea 0-4      Progression   Progression Continue to progress workloads to maintain intensity without signs/symptoms of physical distress.      Resistance Training   Training Prescription Yes    Weight 3    Reps 10-15             Perform Capillary Blood Glucose checks as needed.  Exercise Prescription Changes:   Exercise Prescription Changes     Row Name 03/28/23 1700             Response to Exercise   Blood Pressure (Admit) 122/80       Blood Pressure (Exercise) 130/70       Blood Pressure (Exit) 120/70       Heart Rate (Admit) 74 bpm       Heart Rate (Exercise) 105 bpm       Heart Rate (Exit) 89 bpm       Oxygen Saturation (Admit) 96 %       Oxygen Saturation (Exercise) 98 %       Oxygen Saturation (Exit) 98 %       Rating of Perceived Exertion (Exercise) 11       Perceived Dyspnea (Exercise) 0       Symptoms none       Comments results                 Exercise Comments:   Exercise Goals and Review:   Exercise Goals     Row Name 03/28/23 1718             Exercise Goals   Increase Physical Activity Yes       Intervention Provide advice, education, support and counseling about physical activity/exercise needs.;Develop an individualized exercise prescription for aerobic and resistive training based on initial evaluation findings, risk stratification, comorbidities and participant's personal goals.       Expected Outcomes Short Term: Attend rehab on a regular basis to increase amount of physical activity.;Long Term: Add in home exercise to make exercise part of routine and to increase amount of physical activity.;Long Term: Exercising regularly at least 3-5 days a week.       Increase Strength and Stamina Yes       Intervention Provide advice, education, support and counseling about physical activity/exercise needs.;Develop an individualized exercise prescription for aerobic and resistive training based on initial evaluation findings, risk stratification, comorbidities and participant's personal goals.       Expected Outcomes Short Term: Increase workloads from initial exercise prescription for resistance, speed, and METs.;Short Term: Perform resistance training exercises routinely during rehab and add in resistance training at home;Long Term: Improve cardiorespiratory  fitness, muscular endurance and strength as measured by increased METs and functional capacity ( )       Able to understand and use rate of perceived exertion (RPE) scale Yes       Intervention Provide education and explanation on how to use RPE scale       Expected Outcomes Short Term: Able to use RPE daily in rehab to express subjective intensity level;Long Term:  Able to use RPE to guide intensity level when exercising independently       Able to understand and use Dyspnea scale Yes       Intervention Provide education and explanation on how to use  Dyspnea scale       Expected Outcomes Short Term: Able to use Dyspnea scale daily in rehab to express subjective sense of shortness of breath during exertion;Long Term: Able to use Dyspnea scale to guide intensity level when exercising independently       Knowledge and understanding of Target Heart Rate Range (THRR) Yes       Intervention Provide education and explanation of THRR including how the numbers were predicted and where they are located for reference       Expected Outcomes Short Term: Able to state/look up THRR;Long Term: Able to use THRR to govern intensity when exercising independently;Short Term: Able to use daily as guideline for intensity in rehab       Able to check pulse independently Yes       Intervention Provide education and demonstration on how to check pulse in carotid and radial arteries.;Review the importance of being able to check your own pulse for safety during independent exercise       Expected Outcomes Short Term: Able to explain why pulse checking is important during independent exercise;Long Term: Able to check pulse independently and accurately       Understanding of Exercise Prescription Yes       Intervention Provide education, explanation, and written materials on patient's individual exercise prescription       Expected Outcomes Short Term: Able to explain program exercise prescription;Long Term: Able to explain home exercise prescription to exercise independently                Exercise Goals Re-Evaluation :   Discharge Exercise Prescription (Final Exercise Prescription Changes):  Exercise Prescription Changes - 03/28/23 1700       Response to Exercise   Blood Pressure (Admit) 122/80    Blood Pressure (Exercise) 130/70    Blood Pressure (Exit) 120/70    Heart Rate (Admit) 74 bpm    Heart Rate (Exercise) 105 bpm    Heart Rate (Exit) 89 bpm    Oxygen Saturation (Admit) 96 %    Oxygen Saturation (Exercise) 98 %    Oxygen Saturation (Exit) 98 %     Rating of Perceived Exertion (Exercise) 11    Perceived Dyspnea (Exercise) 0    Symptoms none    Comments results             Nutrition:  Target Goals: Understanding of nutrition guidelines, daily intake of sodium 1500mg , cholesterol 200mg , calories 30% from fat and 7% or less from saturated fats, daily to have 5 or more servings of fruits and vegetables.  Education: All About Nutrition: -Group instruction provided by verbal, written material, interactive activities, discussions, models, and posters to present general guidelines for heart healthy nutrition including fat, fiber, MyPlate, the role of sodium in heart healthy nutrition, utilization of the nutrition label,  and utilization of this knowledge for meal planning. Follow up email sent as well. Written material given at graduation. Flowsheet Row Cardiac Rehab from 03/28/2023 in [facility] Cardiac and Pulmonary Rehab  Education need identified 03/28/23       Biometrics:  Pre Biometrics - 03/28/23 1719       Pre Biometrics   Height 5' 5.5" (1.664 m)    Weight 191 lb 14.4 oz (87 kg)    Waist Circumference 42 inches    Hip Circumference 44 inches    Waist to Hip Ratio 0.95 %    BMI (Calculated) 31.44    Single Leg Stand 21.22 seconds              Nutrition Therapy Plan and Nutrition Goals:  Nutrition Therapy & Goals - 03/28/23 1708       Nutrition Therapy   Diet Carb controlled, Cardiac, Low Na    Protein (specify units) 90    Fiber 25 grams    Whole Grain Foods 3 servings    Saturated Fats 15 max. grams    Fruits and Vegetables 5 servings/day    Sodium 2 grams      Personal Nutrition Goals   Nutrition Goal Drink ~2 bottles of water per day (cut back on coffee)    Personal Goal #2 Be mindful of carbs per meal (30-60gCarbs)    Personal Goal #3 Pair a complex carb with a protein or healthy fat    Comments Patient drinking ~1bottle of water per day, drinks lots of black coffee and unsweet tea. Talked to her  about caffeine and hydration. She reports she could do better with hydration, says she is thirsty often. Set goal to drink more water ~2 bottles per day. She has T2DM with a A1C of 12.6%. doesn't count carbs. Provided carb counting handout, reviewed with her and wrote out carb per meal goal, gave examples and answered questions. Reviewed mediterranean diet handout, focus on reducing red meat, increasing colorful produce and healthy fats. Educated on sodium recommendation and how to read labels. Built out several meals and snacks with foods she likes and will eat. Focus on carb controlled portions paired with lean protein and healthy fats. Encouraged larger portions on low carb veggies when eating out. Reviewed a few good choices at some of her favorite restaurants to help her feel confident when eating out. Showed her a few healthier frozen meals, what to look for on labels and encouraged her to cook small simple meals as a starting point to wean away from fast food.      Intervention Plan   Intervention Prescribe, educate and counsel regarding individualized specific dietary modifications aiming towards targeted core components such as weight, hypertension, lipid management, diabetes, heart failure and other comorbidities.;Nutrition handout(s) given to patient.    Expected Outcomes Short Term Goal: Understand basic principles of dietary content, such as calories, fat, sodium, cholesterol and nutrients.;Short Term Goal: A plan has been developed with personal nutrition goals set during dietitian appointment.;Long Term Goal: Adherence to prescribed nutrition plan.             Nutrition Assessments:  MEDIFICTS Score Key: ?70 Need to make dietary changes  40-70 Heart Healthy Diet ? 40 Therapeutic Level Cholesterol Diet  Flowsheet Row Cardiac Rehab from 03/28/2023 in [facility] Cardiac and Pulmonary Rehab  Picture Your Plate Total Score on Admission 40      Picture Your Plate Scores: <16 Unhealthy  dietary pattern with much room for improvement. 41-50 Dietary  pattern unlikely to meet recommendations for good health and room for improvement. 51-60 More healthful dietary pattern, with some room for improvement.  >60 Healthy dietary pattern, although there may be some specific behaviors that could be improved.    Nutrition Goals Re-Evaluation:   Nutrition Goals Discharge (Final Nutrition Goals Re-Evaluation):   Psychosocial: Target Goals: Acknowledge presence or absence of significant depression and/or stress, maximize coping skills, provide positive support system. Participant is able to verbalize types and ability to use techniques and skills needed for reducing stress and depression.   Education: Stress, Anxiety, and Depression - Group verbal and visual presentation to define topics covered.  Reviews how body is impacted by stress, anxiety, and depression.  Also discusses healthy ways to reduce stress and to treat/manage anxiety and depression.  Written material given at graduation.   Education: Sleep Hygiene -Provides group verbal and written instruction about how sleep can affect your health.  Define sleep hygiene, discuss sleep cycles and impact of sleep habits. Review good sleep hygiene tips.    Initial Review & Psychosocial Screening:  Initial Psych Review & Screening - 03/10/23 1608       Initial Review   Current issues with History of Depression   "some depression but my husband is wonderful support and I don't have an issue with it now"     Family Dynamics   Good Support System? Yes   Lives with husband who is great support; also has grown son that is great support     Barriers   Psychosocial barriers to participate in program The patient should benefit from training in stress management and relaxation.      Screening Interventions   Interventions Encouraged to exercise;To provide support and resources with identified psychosocial needs    Expected Outcomes Short  Term goal: Utilizing psychosocial counselor, staff and physician to assist with identification of specific Stressors or current issues interfering with healing process. Setting desired goal for each stressor or current issue identified.;Long Term Goal: Stressors or current issues are controlled or eliminated.             Quality of Life Scores:   Quality of Life - 03/28/23 1724       Quality of Life   Select Quality of Life      Quality of Life Scores   Health/Function Pre 16.53 %    Socioeconomic Pre 21.06 %    Psych/Spiritual Pre 21.71 %    Family Pre 25.2 %    GLOBAL Pre 19.84 %            Scores of 19 and below usually indicate a poorer quality of life in these areas.  A difference of  2-3 points is a clinically meaningful difference.  A difference of 2-3 points in the total score of the Quality of Life Index has been associated with significant improvement in overall quality of life, self-image, physical symptoms, and general health in studies assessing change in quality of life.  PHQ-9: Review Flowsheet       03/28/2023  Depression screen PHQ 2/9  Decreased Interest 0  Down, Depressed, Hopeless 1  PHQ - 2 Score 1  Altered sleeping 1  Tired, decreased energy 1  Change in appetite 0  Feeling bad or failure about yourself  1  Trouble concentrating 0  Moving slowly or fidgety/restless 0  Suicidal thoughts 0  PHQ-9 Score 4  Difficult doing work/chores Not difficult at all    Details  Interpretation of Total Score  Total Score Depression Severity:  1-4 = Minimal depression, 5-9 = Mild depression, 10-14 = Moderate depression, 15-19 = Moderately severe depression, 20-27 = Severe depression   Psychosocial Evaluation and Intervention:  Psychosocial Evaluation - 03/10/23 1613       Psychosocial Evaluation & Interventions   Interventions Encouraged to exercise with the program and follow exercise prescription;Relaxation education;Stress management  education    Comments Varie is coming to cardiac rehab post STEMI and stent placement. She is feeling back to baseline and ready to begin cardiac rehab.  Vola also had carotid stent placed 04/2022. She has 30+ yr tobacco history; she is ready to quit and has started nicotine patches with great success by reducing from 1.5 pk to 6 cigarettes/day. Jonne has requested additional tobacco cessation material when she arrives to rehab. Murdis has type 2 diabetes controlled by Metformin and insulin. She says it has recently regulated and is well controlled in the past couple weeks. Jodi-Ann reports having neuropathy in both feet and has started wearing diabetic socks that have helped. She lives with her husband and also has a grown son that is local, both are great support! Jaylea has no barriers for attending the program and is excited to start rehab.    Expected Outcomes Short: Attend cardiac rehab for education and exercise  Long: Develop and maintain positive self care habits    Continue Psychosocial Services  Follow up required by staff             Psychosocial Re-Evaluation:   Psychosocial Discharge (Final Psychosocial Re-Evaluation):   Vocational Rehabilitation: Provide vocational rehab assistance to qualifying candidates.   Vocational Rehab Evaluation & Intervention:  Vocational Rehab - 03/10/23 1611       Initial Vocational Rehab Evaluation & Intervention   Assessment shows need for Vocational Rehabilitation No             Education: Education Goals: Education classes will be provided on a variety of topics geared toward better understanding of heart health and risk factor modification. Participant will state understanding/return demonstration of topics presented as noted by education test scores.  Learning Barriers/Preferences:  Learning Barriers/Preferences - 03/10/23 1611       Learning Barriers/Preferences   Learning Barriers None    Learning Preferences None              General Cardiac Education Topics:  AED/CPR: - Group verbal and written instruction with the use of models to demonstrate the basic use of the AED with the basic ABC's of resuscitation.   Anatomy and Cardiac Procedures: - Group verbal and visual presentation and models provide information about basic cardiac anatomy and function. Reviews the testing methods done to diagnose heart disease and the outcomes of the test results. Describes the treatment choices: Medical Management, Angioplasty, or Coronary Bypass Surgery for treating various heart conditions including Myocardial Infarction, Angina, Valve Disease, and Cardiac Arrhythmias.  Written material given at graduation. Flowsheet Row Cardiac Rehab from 03/28/2023 in [facility] Cardiac and Pulmonary Rehab  Education need identified 03/28/23       Medication Safety: - Group verbal and visual instruction to review commonly prescribed medications for heart and lung disease. Reviews the medication, class of the drug, and side effects. Includes the steps to properly store meds and maintain the prescription regimen.  Written material given at graduation.   Intimacy: - Group verbal instruction through game format to discuss how heart and lung disease can affect sexual intimacy. Written material  given at graduation..   Know Your Numbers and Heart Failure: - Group verbal and visual instruction to discuss disease risk factors for cardiac and pulmonary disease and treatment options.  Reviews associated critical values for Overweight/Obesity, Hypertension, Cholesterol, and Diabetes.  Discusses basics of heart failure: signs/symptoms and treatments.  Introduces Heart Failure Zone chart for action plan for heart failure.  Written material given at graduation.   Infection Prevention: - Provides verbal and written material to individual with discussion of infection control including proper hand washing and proper equipment cleaning during exercise  session. Flowsheet Row Cardiac Rehab from 03/28/2023 in [facility] Cardiac and Pulmonary Rehab  Date 03/28/23  Educator [facility]  Instruction Review Code 1- Verbalizes Understanding       Falls Prevention: - Provides verbal and written material to individual with discussion of falls prevention and safety. Flowsheet Row Cardiac Rehab from 03/28/2023 in [facility] Cardiac and Pulmonary Rehab  Date 03/28/23  Educator [facility]  Instruction Review Code 1- Verbalizes Understanding       Other: -Provides group and verbal instruction on various topics (see comments)   Knowledge Questionnaire Score:  Knowledge Questionnaire Score - 03/28/23 1723       Knowledge Questionnaire Score   Pre Score 22/26             Core Components/Risk Factors/Patient Goals at Admission:  Personal Goals and Risk Factors at Admission - 03/28/23 1725       Core Components/Risk Factors/Patient Goals on Admission    Weight Management Yes    Intervention Weight Management: Develop a combined nutrition and exercise program designed to reach desired caloric intake, while maintaining appropriate intake of nutrient and fiber, sodium and fats, and appropriate energy expenditure required for the weight goal.;Weight Management: Provide education and appropriate resources to help participant work on and attain dietary goals.;Weight Management/Obesity: Establish reasonable short term and long term weight goals.    Admit Weight 191 lb 14.4 oz (87 kg)    Goal Weight: Short Term 185 lb (83.9 kg)    Goal Weight: Long Term 145 lb (65.8 kg)    Expected Outcomes Short Term: Continue to assess and modify interventions until short term weight is achieved;Long Term: Adherence to nutrition and physical activity/exercise program aimed toward attainment of established weight goal;Weight Maintenance: Understanding of the daily nutrition guidelines, which includes 25-35% calories from fat, 7% or less cal from saturated fats, less than 200mg  cholesterol,  less than 1.5gm of sodium, & 5 or more servings of fruits and vegetables daily;Weight Loss: Understanding of general recommendations for a balanced deficit meal plan, which promotes 1-2 lb weight loss per week and includes a negative energy balance of [phone] kcal/d;Understanding recommendations for meals to include 15-35% energy as protein, 25-35% energy from fat, 35-60% energy from carbohydrates, less than 200mg  of dietary cholesterol, 20-35 gm of total fiber daily;Understanding of distribution of calorie intake throughout the day with the consumption of 4-5 meals/snacks    Tobacco Cessation Yes    Number of packs per day Ashlea is a current tobacco user. Intervention for tobacco cessation was provided at the initial medical review. Shewas asked about readiness to quit and reported she has cut back to 1/2 pack a day from 1 1/2 and is using patches to aid in quitting. She was also interested in joining a smoking cessation class, for which she was given information. Patient was advised and educated about tobacco cessation using combination therapy, tobacco cessation classes, quit line, and quit smoking apps. Patient demonstrated understanding  of this material. Staff will continue to provide encouragement and follow up with the patient throughout the program.    Intervention Assist the participant in steps to quit. Provide individualized education and counseling about committing to Tobacco Cessation, relapse prevention, and pharmacological support that can be provided by physician.;Education officer, environmental, assist with locating and accessing local/national Quit Smoking programs, and support quit date choice.    Expected Outcomes Short Term: Will quit all tobacco product use, adhering to prevention of relapse plan.;Long Term: Complete abstinence from all tobacco products for at least 12 months from quit date.    Diabetes Yes    Intervention Provide education about signs/symptoms and action to take for  hypo/hyperglycemia.;Provide education about proper nutrition, including hydration, and aerobic/resistive exercise prescription along with prescribed medications to achieve blood glucose in normal ranges: Fasting glucose 65-99 mg/dL    Expected Outcomes Short Term: Participant verbalizes understanding of the signs/symptoms and immediate care of hyper/hypoglycemia, proper foot care and importance of medication, aerobic/resistive exercise and nutrition plan for blood glucose control.;Long Term: Attainment of HbA1C < 7%.    Hypertension Yes    Intervention Provide education on lifestyle modifcations including regular physical activity/exercise, weight management, moderate sodium restriction and increased consumption of fresh fruit, vegetables, and low fat dairy, alcohol moderation, and smoking cessation.;Monitor prescription use compliance.    Expected Outcomes Short Term: Continued assessment and intervention until BP is < 140/18mm HG in hypertensive participants. < 130/59mm HG in hypertensive participants with diabetes, heart failure or chronic kidney disease.;Long Term: Maintenance of blood pressure at goal levels.    Lipids Yes    Intervention Provide education and support for participant on nutrition & aerobic/resistive exercise along with prescribed medications to achieve LDL 70mg , HDL >40mg .    Expected Outcomes Short Term: Participant states understanding of desired cholesterol values and is compliant with medications prescribed. Participant is following exercise prescription and nutrition guidelines.;Long Term: Cholesterol controlled with medications as prescribed, with individualized exercise RX and with personalized nutrition plan. Value goals: LDL < 70mg , HDL > 40 mg.             Education:Diabetes - Individual verbal and written instruction to review signs/symptoms of diabetes, desired ranges of glucose level fasting, after meals and with exercise. Acknowledge that pre and post exercise  glucose checks will be done for 3 sessions at entry of program. Flowsheet Row Cardiac Rehab from 03/28/2023 in [facility] Cardiac [facility] Pulmonary Rehab  Date 03/28/23  Educator [facility]  Instruction Review Code 1- Verbalizes Understanding       Core Components/Risk Factors/Patient Goals Review:    Core Components/Risk Factors/Patient Goals at Discharge (Final Review):    ITP Comments:  ITP Comments     Row Name 03/10/23 1634 03/28/23 1712         ITP Comments Initial phone call completed. Dx can be found in [facility] 6/12. EP orientation scheduled for 7/29 at 3:00 pm. Completed and gym orientation. Initial ITP created and sent for review to Dr. Bethann Punches, Medical Director.Cadi is a current tobacco user. Intervention for tobacco cessation was provided at the initial medical review. Shewas asked about readiness to quit and reported she has cut back to 1/2 pack a day from 1 1/2 and is using patches to aid in quitting. She was also interested in joining a smoking cessation class, for which she was given information. Patient was advised and educated about tobacco cessation using combination therapy, tobacco cessation classes, quit line, and quit smoking apps. Patient demonstrated  understanding of this material. Staff will continue to provide encouragement and follow up with the patient throughout the program.               Comments: initial ITP

## 2023-03-28 NOTE — Patient Instructions (Signed)
Patient Instructions  Patient Details  Name: Lauren Whitney MRN: [id number] Date of Birth: [birth year] Referring Provider:  Marcina Millard, MD  Below are your personal goals for exercise, nutrition, and risk factors. Our goal is to help you stay on track towards obtaining and maintaining these goals. We will be discussing your progress on these goals with you throughout the program.  Initial Exercise Prescription:  Initial Exercise Prescription - 03/28/23 1700       Date of Initial Exercise RX and Referring Provider   Date 03/28/23    Referring Provider Lauren Whitney      Oxygen   Maintain Oxygen Saturation 88% or higher      Treadmill   MPH 2.5    Grade 1    Minutes 15    METs 3.26      Recumbant Bike   Level 2    RPM 50    Watts 20    Minutes 15      Elliptical   Level 1    Speed 3    Minutes 15    METs 3.3      REL-XR   Level 2    Speed 50    Minutes 15    METs 3.3      Track   Laps 42    Minutes 15    METs 3.28      Prescription Details   Frequency (times per week) 3    Duration Progress to 30 minutes of continuous aerobic without signs/symptoms of physical distress      Intensity   THRR 40-80% of Max Heartrate 109-145    Ratings of Perceived Exertion 11-13    Perceived Dyspnea 0-4      Progression   Progression Continue to progress workloads to maintain intensity without signs/symptoms of physical distress.      Resistance Training   Training Prescription Yes    Weight 3    Reps 10-15             Exercise Goals: Frequency: Be able to perform aerobic exercise two to three times per week in program working toward 2-5 days per week of home exercise.  Intensity: Work with a perceived exertion of 11 (fairly light) - 15 (hard) while following your exercise prescription.  We will make changes to your prescription with you as you progress through the program.   Duration: Be able to do 30 to 45 minutes of continuous aerobic exercise in  addition to a 5 minute warm-up and a 5 minute cool-down routine.   Nutrition Goals: Your personal nutrition goals will be established when you do your nutrition analysis with the dietician.  The following are general nutrition guidelines to follow: Cholesterol < 200mg /day Sodium < 1500mg /day Fiber: Women over 50 yrs - 21 grams per day  Personal Goals:  Personal Goals and Risk Factors at Admission - 03/28/23 1725       Core Components/Risk Factors/Patient Goals on Admission    Weight Management Yes    Intervention Weight Management: Develop a combined nutrition and exercise program designed to reach desired caloric intake, while maintaining appropriate intake of nutrient and fiber, sodium and fats, and appropriate energy expenditure required for the weight goal.;Weight Management: Provide education and appropriate resources to help participant work on and attain dietary goals.;Weight Management/Obesity: Establish reasonable short term and long term weight goals.    Admit Weight 191 lb 14.4 oz (87 kg)    Goal Weight: Short Term 185 lb (83.9 kg)  Goal Weight: Long Term 145 lb (65.8 kg)    Expected Outcomes Short Term: Continue to assess and modify interventions until short term weight is achieved;Long Term: Adherence to nutrition and physical activity/exercise program aimed toward attainment of established weight goal;Weight Maintenance: Understanding of the daily nutrition guidelines, which includes 25-35% calories from fat, 7% or less cal from saturated fats, less than 200mg  cholesterol, less than 1.5gm of sodium, & 5 or more servings of fruits and vegetables daily;Weight Loss: Understanding of general recommendations for a balanced deficit meal plan, which promotes 1-2 lb weight loss per week and includes a negative energy balance of [phone] kcal/d;Understanding recommendations for meals to include 15-35% energy as protein, 25-35% energy from fat, 35-60% energy from carbohydrates, less than  200mg  of dietary cholesterol, 20-35 gm of total fiber daily;Understanding of distribution of calorie intake throughout the day with the consumption of 4-5 meals/snacks    Tobacco Cessation Yes    Number of packs per day Lauren Whitney is a current tobacco user. Intervention for tobacco cessation was provided at the initial medical review. Shewas asked about readiness to quit and reported she has cut back to 1/2 pack a day from 1 1/2 and is using patches to aid in quitting. She was also interested in joining a smoking cessation class, for which she was given information. Patient was advised and educated about tobacco cessation using combination therapy, tobacco cessation classes, quit line, and quit smoking apps. Patient demonstrated understanding of this material. Staff will continue to provide encouragement and follow up with the patient throughout the program.    Intervention Assist the participant in steps to quit. Provide individualized education and counseling about committing to Tobacco Cessation, relapse prevention, and pharmacological support that can be provided by physician.;Education officer, environmental, assist with locating and accessing local/national Quit Smoking programs, and support quit date choice.    Expected Outcomes Short Term: Will quit all tobacco product use, adhering to prevention of relapse plan.;Long Term: Complete abstinence from all tobacco products for at least 12 months from quit date.    Diabetes Yes    Intervention Provide education about signs/symptoms and action to take for hypo/hyperglycemia.;Provide education about proper nutrition, including hydration, and aerobic/resistive exercise prescription along with prescribed medications to achieve blood glucose in normal ranges: Fasting glucose 65-99 mg/dL    Expected Outcomes Short Term: Participant verbalizes understanding of the signs/symptoms and immediate care of hyper/hypoglycemia, proper foot care and importance of medication,  aerobic/resistive exercise and nutrition plan for blood glucose control.;Long Term: Attainment of HbA1C < 7%.    Hypertension Yes    Intervention Provide education on lifestyle modifcations including regular physical activity/exercise, weight management, moderate sodium restriction and increased consumption of fresh fruit, vegetables, and low fat dairy, alcohol moderation, and smoking cessation.;Monitor prescription use compliance.    Expected Outcomes Short Term: Continued assessment and intervention until BP is < 140/96mm HG in hypertensive participants. < 130/35mm HG in hypertensive participants with diabetes, heart failure or chronic kidney disease.;Long Term: Maintenance of blood pressure at goal levels.    Lipids Yes    Intervention Provide education and support for participant on nutrition & aerobic/resistive exercise along with prescribed medications to achieve LDL 70mg , HDL >40mg .    Expected Outcomes Short Term: Participant states understanding of desired cholesterol values and is compliant with medications prescribed. Participant is following exercise prescription and nutrition guidelines.;Long Term: Cholesterol controlled with medications as prescribed, with individualized exercise RX and with personalized nutrition plan. Value goals: LDL < 70mg , HDL >  40 mg.             Tobacco Use Initial Evaluation: Social History   Tobacco Use  Smoking Status Every Day   Current packs/day: 1.00   Average packs/day: 1 pack/day for 30.0 years (30.0 ttl pk-yrs)   Types: Cigarettes  Smokeless Tobacco Never    Exercise Goals and Review:  Exercise Goals     Row Name 03/28/23 1718             Exercise Goals   Increase Physical Activity Yes       Intervention Provide advice, education, support and counseling about physical activity/exercise needs.;Develop an individualized exercise prescription for aerobic and resistive training based on initial evaluation findings, risk stratification,  comorbidities and participant's personal goals.       Expected Outcomes Short Term: Attend rehab on a regular basis to increase amount of physical activity.;Long Term: Add in home exercise to make exercise part of routine and to increase amount of physical activity.;Long Term: Exercising regularly at least 3-5 days a week.       Increase Strength and Stamina Yes       Intervention Provide advice, education, support and counseling about physical activity/exercise needs.;Develop an individualized exercise prescription for aerobic and resistive training based on initial evaluation findings, risk stratification, comorbidities and participant's personal goals.       Expected Outcomes Short Term: Increase workloads from initial exercise prescription for resistance, speed, and METs.;Short Term: Perform resistance training exercises routinely during rehab and add in resistance training at home;Long Term: Improve cardiorespiratory fitness, muscular endurance and strength as measured by increased METs and functional capacity ( )       Able to understand and use rate of perceived exertion (RPE) scale Yes       Intervention Provide education and explanation on how to use RPE scale       Expected Outcomes Short Term: Able to use RPE daily in rehab to express subjective intensity level;Long Term:  Able to use RPE to guide intensity level when exercising independently       Able to understand and use Dyspnea scale Yes       Intervention Provide education and explanation on how to use Dyspnea scale       Expected Outcomes Short Term: Able to use Dyspnea scale daily in rehab to express subjective sense of shortness of breath during exertion;Long Term: Able to use Dyspnea scale to guide intensity level when exercising independently       Knowledge and understanding of Target Heart Rate Range (THRR) Yes       Intervention Provide education and explanation of THRR including how the numbers were predicted and where they  are located for reference       Expected Outcomes Short Term: Able to state/look up THRR;Long Term: Able to use THRR to govern intensity when exercising independently;Short Term: Able to use daily as guideline for intensity in rehab       Able to check pulse independently Yes       Intervention Provide education and demonstration on how to check pulse in carotid and radial arteries.;Review the importance of being able to check your own pulse for safety during independent exercise       Expected Outcomes Short Term: Able to explain why pulse checking is important during independent exercise;Long Term: Able to check pulse independently and accurately       Understanding of Exercise Prescription Yes       Intervention Provide  education, explanation, and written materials on patient's individual exercise prescription       Expected Outcomes Short Term: Able to explain program exercise prescription;Long Term: Able to explain home exercise prescription to exercise independently                Copy of goals given to participant.

## 2023-03-28 NOTE — Progress Notes (Signed)
Assessment start time: 3:56 PM  Comorbidities T2DM A1C 12.6%  Digestive issues/concerns: no known food allergies, no concerns other than no hard foods due to no bottom teeth  24-hours Recall: B: greek yogurt L: eats out a lot with coworker ([place], [place]) or frozen meal  D: frozen meals, or fast food ([employer], [employer])  Beverages Black coffee, unsweet tea - with splenda,   Education r/t nutrition plan: Patient drinking ~1bottle of water per day, drinks lots of black coffee and unsweet tea. Talked to her about caffeine and hydration. She reports she could do better with hydration, says she is thirsty often. Set goal to drink more water ~2 bottles per day. She has T2DM with a A1C of 12.6%. doesn't count carbs. Provided carb counting handout, reviewed with her and wrote out carb per meal goal, gave examples and answered questions. Reviewed mediterranean diet handout, focus on reducing red meat, increasing colorful produce and healthy fats. Educated on sodium recommendation and how to read labels. Built out several meals and snacks with foods she likes and will eat. Focus on carb controlled portions paired with lean protein and healthy fats. Encouraged larger portions on low carb veggies when eating out. Reviewed a few good choices at some of her favorite restaurants to help her feel confident when eating out. Showed her a few healthier frozen meals, what to look for on labels and encouraged her to cook small simple meals as a starting point to wean away from fast food.   Goal 1: Drink ~2 bottles of water per day (cut back on coffee) Goal 2: Be mindful of carbs per meal (30-60gCarbs) Goal 3: Pair a complex carb with a protein or healthy fat  End time 4:54 PM

## 2023-04-04 ENCOUNTER — Encounter: Payer: Managed Care, Other (non HMO) | Attending: Cardiology | Admitting: *Deleted

## 2023-04-04 DIAGNOSIS — I213 ST elevation (STEMI) myocardial infarction of unspecified site: Secondary | ICD-10-CM | POA: Diagnosis present

## 2023-04-04 DIAGNOSIS — Z955 Presence of coronary angioplasty implant and graft: Secondary | ICD-10-CM | POA: Insufficient documentation

## 2023-04-04 LAB — GLUCOSE, CAPILLARY
Glucose-Capillary: 156 mg/dL — ABNORMAL HIGH (ref 70–99)
Glucose-Capillary: 149 mg/dL — ABNORMAL HIGH (ref 70–99)

## 2023-04-04 NOTE — Progress Notes (Signed)
Daily Session Note  Patient Details  Name: Lauren Whitney MRN: [id number] Date of Birth: [birth year] Referring Provider:   Flowsheet Row Cardiac Rehab [facility]/28/2023 in [facility] Cardiac [facility] Pul[facility] Rehab  Referring Provider Paraschos       Encounter Date: 04/04/2023  Check In:  Session Check In - 04/04/23 1527       Check-In   Supervising physician immediately available to respond to emergencies See telemetry face sheet for immediately available ER MD    Location [facility]-Cardiac & Pulmonary Rehab    Staff Present Susann Givens, RN BSN;Joseph Huntley, [place]   Max Archie   Virtual Visit No    Medication changes reported     No    Fall or balance concerns reported    No    Tobacco Cessation No Change    Current number of cigarettes/nicotine per day     10    Warm-up and Cool-down Performed on first and last piece of equipment    Resistance Training Performed Yes    VAD Patient? No    PAD/SET Patient? No      Pain Assessment   Currently in Pain? No/denies                Social History   Tobacco Use  Smoking Status Every Day   Current packs/day: 1.00   Average packs/day: 1 pack/day for 30.0 years (30.0 ttl pk-yrs)   Types: Cigarettes  Smokeless Tobacco Never    Goals Met:  Independence with exercise equipment Exercise tolerated well No report of concerns or symptoms today Strength training completed today  Goals Unmet:  Not Applicable  Comments: First full day of exercise!  Patient was oriented to gym and equipment including functions, settings, policies, and procedures.  Patient's individual exercise prescription and treatment plan were reviewed.  All starting workloads were established based on the results of the 6 minute walk test done at initial orientation visit.  The plan for exercise progression was also introduced and progression will be customized based on patient's performance and goals.     Dr. Bethann Punches is Medical Director for [facility]  Cardiac Rehabilitation.  Dr. Vida Rigger is Medical Director for [facility] Pulmonary Rehabilitation.

## 2023-04-06 ENCOUNTER — Encounter: Payer: Self-pay | Admitting: *Deleted

## 2023-04-06 DIAGNOSIS — Z955 Presence of coronary angioplasty implant and graft: Secondary | ICD-10-CM

## 2023-04-06 DIAGNOSIS — I213 ST elevation (STEMI) myocardial infarction of unspecified site: Secondary | ICD-10-CM

## 2023-04-06 NOTE — Progress Notes (Signed)
Cardiac Individual Treatment Plan  Patient Details  Name: Lauren Whitney MRN: [id number] Date of Birth: May 06, 1966 Referring Provider:   Flowsheet Row Cardiac Rehab from 03/28/2023 in [facility] Cardiac and Pulmonary Rehab  Referring Provider Paraschos       Initial Encounter Date:  Flowsheet Row Cardiac Rehab from 03/28/2023 in [facility] Cardiac and Pulmonary Rehab  Date 03/28/23       Visit Diagnosis: ST elevation myocardial infarction (STEMI), unspecified artery [facility])  Status post coronary artery stent placement  Patient's Home Medications on Admission:  Current Outpatient Medications:    acetaminophen (TYLENOL) 500 MG tablet, Take 1,000 mg by mouth every 6 (six) hours as needed (for pain.)., Disp: , Rfl:    atorvastatin (LIPITOR) 80 MG tablet, Take 1 tablet (80 mg total) by mouth daily., Disp: 30 tablet, Rfl: 0   Blood Glucose Monitoring Suppl KIT, 1 kit by Does not apply route daily as needed., Disp: 1 kit, Rfl: 0   Continuous Blood Gluc Sensor (FREESTYLE LIBRE 2 SENSOR) MISC, USE 1 KIT EVERY 14 (FOURTEEN) DAYS FOR GLUCOSE MONITORING, Disp: , Rfl:    dicyclomine (BENTYL) 10 MG capsule, Take 1 capsule (10 mg total) by mouth 3 (three) times daily as needed for spasms., Disp: 30 capsule, Rfl: 0   metFORMIN (GLUCOPHAGE-XR) 500 MG 24 hr tablet, Take 2,000 mg by mouth every evening., Disp: , Rfl:    metoprolol succinate (TOPROL-XL) 25 MG 24 hr tablet, Take 0.5 tablets (12.5 mg total) by mouth daily., Disp: 15 tablet, Rfl: 0   nicotine (NICODERM CQ - DOSED IN MG/24 HOURS) 14 mg/24hr patch, Place 1 patch (14 mg total) onto the skin daily., Disp: 28 patch, Rfl: 0   nitroGLYCERIN (NITROSTAT) 0.4 MG SL tablet, Place 1 tablet (0.4 mg total) under the tongue every 5 (five) minutes x 3 doses as needed for chest pain., Disp: 100 tablet, Rfl: 0  Past Medical History: Past Medical History:  Diagnosis Date   Cholelithiases    Diabetes (HCC)    GERD (gastroesophageal reflux disease)    due to  gallbladder-no meds   Hypertension    off meds x 2 years-better control now    Tobacco Use: Social History   Tobacco Use  Smoking Status Every Day   Current packs/day: 1.00   Average packs/day: 1 pack/day for 30.0 years (30.0 ttl pk-yrs)   Types: Cigarettes  Smokeless Tobacco Never    Labs: Review Flowsheet       Latest Ref Rng & Units 04/07/2021 06/07/2022 02/09/2023  Labs for ITP Cardiac and Pulmonary Rehab  Cholestrol 0 - 200 mg/dL - - 409   LDL (calc) 0 - 99 mg/dL - - 811   HDL-C >91 mg/dL - - 34   Trlycerides <478 mg/dL - - 295   Hemoglobin A2Z 4.8 - 5.6 % 10.8  8.1  12.6     Details             Exercise Target Goals: Exercise Program Goal: Individual exercise prescription set using results from initial 6 min walk test and THRR while considering  patient's activity barriers and safety.   Exercise Prescription Goal: Initial exercise prescription builds to 30-45 minutes a day of aerobic activity, 2-3 days per week.  Home exercise guidelines will be given to patient during program as part of exercise prescription that the participant will acknowledge.   Education: Aerobic Exercise: - Group verbal and visual presentation on the components of exercise prescription. Introduces F.I.T.T principle from ACSM for exercise prescriptions.  Reviews F.I.T.T. principles of aerobic exercise including progression. Written material given at graduation. Flowsheet Row Cardiac Rehab from 03/28/2023 in [facility] Cardiac and Pulmonary Rehab  Education need identified 03/28/23       Education: Resistance Exercise: - Group verbal and visual presentation on the components of exercise prescription. Introduces F.I.T.T principle from ACSM for exercise prescriptions  Reviews F.I.T.T. principles of resistance exercise including progression. Written material given at graduation.    Education: Exercise & Equipment Safety: - Individual verbal instruction and demonstration of equipment use and safety  with use of the equipment. Flowsheet Row Cardiac Rehab from 03/28/2023 in [facility] Cardiac and Pulmonary Rehab  Date 03/28/23  Educator [employer]  Instruction Review Code 1- Verbalizes Understanding       Education: Exercise Physiology & General Exercise Guidelines: - Group verbal and written instruction with models to review the exercise physiology of the cardiovascular system and associated critical values. Provides general exercise guidelines with specific guidelines to those with heart or lung disease.  Flowsheet Row Cardiac Rehab from 03/28/2023 in [facility] Cardiac and Pulmonary Rehab  Education need identified 03/28/23       Education: Flexibility, Balance, Mind/Body Relaxation: - Group verbal and visual presentation with interactive activity on the components of exercise prescription. Introduces F.I.T.T principle from ACSM for exercise prescriptions. Reviews F.I.T.T. principles of flexibility and balance exercise training including progression. Also discusses the mind body connection.  Reviews various relaxation techniques to help reduce and manage stress (i.e. Deep breathing, progressive muscle relaxation, and visualization). Balance handout provided to take home. Written material given at graduation.   Activity Barriers & Risk Stratification:  Activity Barriers & Cardiac Risk Stratification - 03/28/23 1715       Activity Barriers & Cardiac Risk Stratification   Activity Barriers Other (comment)    Comments Neuropathy    Cardiac Risk Stratification High             6 Minute Walk:  6 Minute Walk     Row Name 03/28/23 1714         6 Minute Walk   Phase Initial     Distance 1245 feet     Walk Time 6 minutes     # of Rest Breaks 0     MPH 2.36     METS 3.3     RPE 11     Perceived Dyspnea  0     VO2 Peak 11.57     Symptoms No     Resting HR 74 bpm     Resting BP 122/80     Resting Oxygen Saturation  96 %     Exercise Oxygen Saturation  during 6 min walk 98 %     Max Ex.  HR 105 bpm     Max Ex. BP 130/70     2 Minute Post BP 132/80              Oxygen Initial Assessment:   Oxygen Re-Evaluation:   Oxygen Discharge (Final Oxygen Re-Evaluation):   Initial Exercise Prescription:  Initial Exercise Prescription - 03/28/23 1700       Date of Initial Exercise RX and Referring Provider   Date 03/28/23    Referring Provider Paraschos      Oxygen   Maintain Oxygen Saturation 88% or higher      Treadmill   MPH 2.5    Grade 1    Minutes 15    METs 3.26      Recumbant Bike   Level  2    RPM 50    Watts 20    Minutes 15      Elliptical   Level 1    Speed 3    Minutes 15    METs 3.3      REL-XR   Level 2    Speed 50    Minutes 15    METs 3.3      Track   Laps 42    Minutes 15    METs 3.28      Prescription Details   Frequency (times per week) 3    Duration Progress to 30 minutes of continuous aerobic without signs/symptoms of physical distress      Intensity   THRR 40-80% of Max Heartrate 109-145    Ratings of Perceived Exertion 11-13    Perceived Dyspnea 0-4      Progression   Progression Continue to progress workloads to maintain intensity without signs/symptoms of physical distress.      Resistance Training   Training Prescription Yes    Weight 3    Reps 10-15             Perform Capillary Blood Glucose checks as needed.  Exercise Prescription Changes:   Exercise Prescription Changes     Row Name 03/28/23 1700             Response to Exercise   Blood Pressure (Admit) 122/80       Blood Pressure (Exercise) 130/70       Blood Pressure (Exit) 120/70       Heart Rate (Admit) 74 bpm       Heart Rate (Exercise) 105 bpm       Heart Rate (Exit) 89 bpm       Oxygen Saturation (Admit) 96 %       Oxygen Saturation (Exercise) 98 %       Oxygen Saturation (Exit) 98 %       Rating of Perceived Exertion (Exercise) 11       Perceived Dyspnea (Exercise) 0       Symptoms none       Comments results                 Exercise Comments:   Exercise Comments     Row Name 04/04/23 1530           Exercise Comments First full day of exercise!  Patient was oriented to gym and equipment including functions, settings, policies, and procedures.  Patient's individual exercise prescription and treatment plan were reviewed.  All starting workloads were established based on the results of the 6 minute walk test done at initial orientation visit.  The plan for exercise progression was also introduced and progression will be customized based on patient's performance and goals.                Exercise Goals and Review:   Exercise Goals     Row Name 03/28/23 1718             Exercise Goals   Increase Physical Activity Yes       Intervention Provide advice, education, support and counseling about physical activity/exercise needs.;Develop an individualized exercise prescription for aerobic and resistive training based on initial evaluation findings, risk stratification, comorbidities and participant's personal goals.       Expected Outcomes Short Term: Attend rehab on a regular basis to increase amount of physical activity.;Long Term: Add in home exercise to make exercise  part of routine and to increase amount of physical activity.;Long Term: Exercising regularly at least 3-5 days a week.       Increase Strength and Stamina Yes       Intervention Provide advice, education, support and counseling about physical activity/exercise needs.;Develop an individualized exercise prescription for aerobic and resistive training based on initial evaluation findings, risk stratification, comorbidities and participant's personal goals.       Expected Outcomes Short Term: Increase workloads from initial exercise prescription for resistance, speed, and METs.;Short Term: Perform resistance training exercises routinely during rehab and add in resistance training at home;Long Term: Improve cardiorespiratory fitness,  muscular endurance and strength as measured by increased METs and functional capacity ( )       Able to understand and use rate of perceived exertion (RPE) scale Yes       Intervention Provide education and explanation on how to use RPE scale       Expected Outcomes Short Term: Able to use RPE daily in rehab to express subjective intensity level;Long Term:  Able to use RPE to guide intensity level when exercising independently       Able to understand and use Dyspnea scale Yes       Intervention Provide education and explanation on how to use Dyspnea scale       Expected Outcomes Short Term: Able to use Dyspnea scale daily in rehab to express subjective sense of shortness of breath during exertion;Long Term: Able to use Dyspnea scale to guide intensity level when exercising independently       Knowledge and understanding of Target Heart Rate Range (THRR) Yes       Intervention Provide education and explanation of THRR including how the numbers were predicted and where they are located for reference       Expected Outcomes Short Term: Able to state/look up THRR;Long Term: Able to use THRR to govern intensity when exercising independently;Short Term: Able to use daily as guideline for intensity in rehab       Able to check pulse independently Yes       Intervention Provide education and demonstration on how to check pulse in carotid and radial arteries.;Review the importance of being able to check your own pulse for safety during independent exercise       Expected Outcomes Short Term: Able to explain why pulse checking is important during independent exercise;Long Term: Able to check pulse independently and accurately       Understanding of Exercise Prescription Yes       Intervention Provide education, explanation, and written materials on patient's individual exercise prescription       Expected Outcomes Short Term: Able to explain program exercise prescription;Long Term: Able to explain home  exercise prescription to exercise independently                Exercise Goals Re-Evaluation :  Exercise Goals Re-Evaluation     Row Name 04/04/23 1530             Exercise Goal Re-Evaluation   Exercise Goals Review Increase Physical Activity;Able to understand and use rate of perceived exertion (RPE) scale;Knowledge and understanding of Target Heart Rate Range (THRR);Understanding of Exercise Prescription;Increase Strength and Stamina;Able to check pulse independently       Comments Reviewed RPE  and dyspnea scale, THR and program prescription with pt today.  Pt voiced understanding and was given a copy of goals to take home.       Expected Outcomes  Short: Use RPE daily to regulate intensity.  Long: Follow program prescription in THR.                Discharge Exercise Prescription (Final Exercise Prescription Changes):  Exercise Prescription Changes - 03/28/23 1700       Response to Exercise   Blood Pressure (Admit) 122/80    Blood Pressure (Exercise) 130/70    Blood Pressure (Exit) 120/70    Heart Rate (Admit) 74 bpm    Heart Rate (Exercise) 105 bpm    Heart Rate (Exit) 89 bpm    Oxygen Saturation (Admit) 96 %    Oxygen Saturation (Exercise) 98 %    Oxygen Saturation (Exit) 98 %    Rating of Perceived Exertion (Exercise) 11    Perceived Dyspnea (Exercise) 0    Symptoms none    Comments results             Nutrition:  Target Goals: Understanding of nutrition guidelines, daily intake of sodium 1500mg , cholesterol 200mg , calories 30% from fat and 7% or less from saturated fats, daily to have 5 or more servings of fruits and vegetables.  Education: All About Nutrition: -Group instruction provided by verbal, written material, interactive activities, discussions, models, and posters to present general guidelines for heart healthy nutrition including fat, fiber, MyPlate, the role of sodium in heart healthy nutrition, utilization of the nutrition label, and  utilization of this knowledge for meal planning. Follow up email sent as well. Written material given at graduation. Flowsheet Row Cardiac Rehab from 03/28/2023 in [facility] Cardiac and Pulmonary Rehab  Education need identified 03/28/23       Biometrics:  Pre Biometrics - 03/28/23 1719       Pre Biometrics   Height 5' 5.5" (1.664 m)    Weight 191 lb 14.4 oz (87 kg)    Waist Circumference 42 inches    Hip Circumference 44 inches    Waist to Hip Ratio 0.95 %    BMI (Calculated) 31.44    Single Leg Stand 21.22 seconds              Nutrition Therapy Plan and Nutrition Goals:  Nutrition Therapy & Goals - 03/28/23 1708       Nutrition Therapy   Diet Carb controlled, Cardiac, Low Na    Protein (specify units) 90    Fiber 25 grams    Whole Grain Foods 3 servings    Saturated Fats 15 max. grams    Fruits and Vegetables 5 servings/day    Sodium 2 grams      Personal Nutrition Goals   Nutrition Goal Drink ~2 bottles of water per day (cut back on coffee)    Personal Goal #2 Be mindful of carbs per meal (30-60gCarbs)    Personal Goal #3 Pair a complex carb with a protein or healthy fat    Comments Patient drinking ~1bottle of water per day, drinks lots of black coffee and unsweet tea. Talked to her about caffeine and hydration. She reports she could do better with hydration, says she is thirsty often. Set goal to drink more water ~2 bottles per day. She has T2DM with a A1C of 12.6%. doesn't count carbs. Provided carb counting handout, reviewed with her and wrote out carb per meal goal, gave examples and answered questions. Reviewed mediterranean diet handout, focus on reducing red meat, increasing colorful produce and healthy fats. Educated on sodium recommendation and how to read labels. Built out several meals and snacks with foods  she likes and will eat. Focus on carb controlled portions paired with lean protein and healthy fats. Encouraged larger portions on low carb veggies when eating  out. Reviewed a few good choices at some of her favorite restaurants to help her feel confident when eating out. Showed her a few healthier frozen meals, what to look for on labels and encouraged her to cook small simple meals as a starting point to wean away from fast food.      Intervention Plan   Intervention Prescribe, educate and counsel regarding individualized specific dietary modifications aiming towards targeted core components such as weight, hypertension, lipid management, diabetes, heart failure and other comorbidities.;Nutrition handout(s) given to patient.    Expected Outcomes Short Term Goal: Understand basic principles of dietary content, such as calories, fat, sodium, cholesterol and nutrients.;Short Term Goal: A plan has been developed with personal nutrition goals set during dietitian appointment.;Long Term Goal: Adherence to prescribed nutrition plan.             Nutrition Assessments:  MEDIFICTS Score Key: ?70 Need to make dietary changes  40-70 Heart Healthy Diet ? 40 Therapeutic Level Cholesterol Diet  Flowsheet Row Cardiac Rehab from 03/28/2023 in [facility] Cardiac and Pulmonary Rehab  Picture Your Plate Total Score on Admission 40      Picture Your Plate Scores: <16 Unhealthy dietary pattern with much room for improvement. 41-50 Dietary pattern unlikely to meet recommendations for good health and room for improvement. 51-60 More healthful dietary pattern, with some room for improvement.  >60 Healthy dietary pattern, although there may be some specific behaviors that could be improved.    Nutrition Goals Re-Evaluation:   Nutrition Goals Discharge (Final Nutrition Goals Re-Evaluation):   Psychosocial: Target Goals: Acknowledge presence or absence of significant depression and/or stress, maximize coping skills, provide positive support system. Participant is able to verbalize types and ability to use techniques and skills needed for reducing stress and  depression.   Education: Stress, Anxiety, and Depression - Group verbal and visual presentation to define topics covered.  Reviews how body is impacted by stress, anxiety, and depression.  Also discusses healthy ways to reduce stress and to treat/manage anxiety and depression.  Written material given at graduation.   Education: Sleep Hygiene -Provides group verbal and written instruction about how sleep can affect your health.  Define sleep hygiene, discuss sleep cycles and impact of sleep habits. Review good sleep hygiene tips.    Initial Review & Psychosocial Screening:  Initial Psych Review & Screening - 03/10/23 1608       Initial Review   Current issues with History of Depression   "some depression but my husband is wonderful support and I don't have an issue with it now"     Family Dynamics   Good Support System? Yes   Lives with husband who is great support; also has grown son that is great support     Barriers   Psychosocial barriers to participate in program The patient should benefit from training in stress management and relaxation.      Screening Interventions   Interventions Encouraged to exercise;To provide support and resources with identified psychosocial needs    Expected Outcomes Short Term goal: Utilizing psychosocial counselor, staff and physician to assist with identification of specific Stressors or current issues interfering with healing process. Setting desired goal for each stressor or current issue identified.;Long Term Goal: Stressors or current issues are controlled or eliminated.  Quality of Life Scores:   Quality of Life - 03/28/23 1724       Quality of Life   Select Quality of Life      Quality of Life Scores   Health/Function Pre 16.53 %    Socioeconomic Pre 21.06 %    Psych/Spiritual Pre 21.71 %    Family Pre 25.2 %    GLOBAL Pre 19.84 %            Scores of 19 and below usually indicate a poorer quality of life in these  areas.  A difference of  2-3 points is a clinically meaningful difference.  A difference of 2-3 points in the total score of the Quality of Life Index has been associated with significant improvement in overall quality of life, self-image, physical symptoms, and general health in studies assessing change in quality of life.  PHQ-9: Review Flowsheet       03/28/2023  Depression screen PHQ 2/9  Decreased Interest 0  Down, Depressed, Hopeless 1  PHQ - 2 Score 1  Altered sleeping 1  Tired, decreased energy 1  Change in appetite 0  Feeling bad or failure about yourself  1  Trouble concentrating 0  Moving slowly or fidgety/restless 0  Suicidal thoughts 0  PHQ-9 Score 4  Difficult doing work/chores Not difficult at all    Details           Interpretation of Total Score  Total Score Depression Severity:  1-4 = Minimal depression, 5-9 = Mild depression, 10-14 = Moderate depression, 15-19 = Moderately severe depression, 20-27 = Severe depression   Psychosocial Evaluation and Intervention:  Psychosocial Evaluation - 03/10/23 1613       Psychosocial Evaluation & Interventions   Interventions Encouraged to exercise with the program and follow exercise prescription;Relaxation education;Stress management education    Comments Jayniah is coming to cardiac rehab post STEMI and stent placement. She is feeling back to baseline and ready to begin cardiac rehab.  Infantof also had carotid stent placed 04/2022. She has 30+ yr tobacco history; she is ready to quit and has started nicotine patches with great success by reducing from 1.5 pk to 6 cigarettes/day. Arayia has requested additional tobacco cessation material when she arrives to rehab. Cystal has type 2 diabetes controlled by Metformin and insulin. She says it has recently regulated and is well controlled in the past couple weeks. Sriya reports having neuropathy in both feet and has started wearing diabetic socks that have helped. She lives with  her husband and also has a grown son that is local, both are great support! Kahlila has no barriers for attending the program and is excited to start rehab.    Expected Outcomes Short: Attend cardiac rehab for education and exercise  Long: Develop and maintain positive self care habits    Continue Psychosocial Services  Follow up required by staff             Psychosocial Re-Evaluation:   Psychosocial Discharge (Final Psychosocial Re-Evaluation):   Vocational Rehabilitation: Provide vocational rehab assistance to qualifying candidates.   Vocational Rehab Evaluation & Intervention:  Vocational Rehab - 03/10/23 1611       Initial Vocational Rehab Evaluation & Intervention   Assessment shows need for Vocational Rehabilitation No             Education: Education Goals: Education classes will be provided on a variety of topics geared toward better understanding of heart health and risk factor modification. Participant will state  understanding/return demonstration of topics presented as noted by education test scores.  Learning Barriers/Preferences:  Learning Barriers/Preferences - 03/10/23 1611       Learning Barriers/Preferences   Learning Barriers None    Learning Preferences None             General Cardiac Education Topics:  AED/CPR: - Group verbal and written instruction with the use of models to demonstrate the basic use of the AED with the basic ABC's of resuscitation.   Anatomy and Cardiac Procedures: - Group verbal and visual presentation and models provide information about basic cardiac anatomy and function. Reviews the testing methods done to diagnose heart disease and the outcomes of the test results. Describes the treatment choices: Medical Management, Angioplasty, or Coronary Bypass Surgery for treating various heart conditions including Myocardial Infarction, Angina, Valve Disease, and Cardiac Arrhythmias.  Written material given at  graduation. Flowsheet Row Cardiac Rehab from 03/28/2023 in [facility] Cardiac and Pulmonary Rehab  Education need identified 03/28/23       Medication Safety: - Group verbal and visual instruction to review commonly prescribed medications for heart and lung disease. Reviews the medication, class of the drug, and side effects. Includes the steps to properly store meds and maintain the prescription regimen.  Written material given at graduation.   Intimacy: - Group verbal instruction through game format to discuss how heart and lung disease can affect sexual intimacy. Written material given at graduation..   Know Your Numbers and Heart Failure: - Group verbal and visual instruction to discuss disease risk factors for cardiac and pulmonary disease and treatment options.  Reviews associated critical values for Overweight/Obesity, Hypertension, Cholesterol, and Diabetes.  Discusses basics of heart failure: signs/symptoms and treatments.  Introduces Heart Failure Zone chart for action plan for heart failure.  Written material given at graduation.   Infection Prevention: - Provides verbal and written material to individual with discussion of infection control including proper hand washing and proper equipment cleaning during exercise session. Flowsheet Row Cardiac Rehab from 03/28/2023 in [facility] Cardiac and Pulmonary Rehab  Date 03/28/23  Educator [facility]  Instruction Review Code 1- Verbalizes Understanding       Falls Prevention: - Provides verbal and written material to individual with discussion of falls prevention and safety. Flowsheet Row Cardiac Rehab from 03/28/2023 in [facility] Cardiac and Pulmonary Rehab  Date 03/28/23  Educator [facility]  Instruction Review Code 1- Verbalizes Understanding       Other: -Provides group and verbal instruction on various topics (see comments)   Knowledge Questionnaire Score:  Knowledge Questionnaire Score - 03/28/23 1723       Knowledge Questionnaire Score   Pre  Score 22/26             Core Components/Risk Factors/Patient Goals at Admission:  Personal Goals and Risk Factors at Admission - 03/28/23 1725       Core Components/Risk Factors/Patient Goals on Admission    Weight Management Yes    Intervention Weight Management: Develop a combined nutrition and exercise program designed to reach desired caloric intake, while maintaining appropriate intake of nutrient and fiber, sodium and fats, and appropriate energy expenditure required for the weight goal.;Weight Management: Provide education and appropriate resources to help participant work on and attain dietary goals.;Weight Management/Obesity: Establish reasonable short term and long term weight goals.    Admit Weight 191 lb 14.4 oz (87 kg)    Goal Weight: Short Term 185 lb (83.9 kg)    Goal Weight: Long Term 145 lb (65.8  kg)    Expected Outcomes Short Term: Continue to assess and modify interventions until short term weight is achieved;Long Term: Adherence to nutrition and physical activity/exercise program aimed toward attainment of established weight goal;Weight Maintenance: Understanding of the daily nutrition guidelines, which includes 25-35% calories from fat, 7% or less cal from saturated fats, less than 200mg  cholesterol, less than 1.5gm of sodium, & 5 or more servings of fruits and vegetables daily;Weight Loss: Understanding of general recommendations for a balanced deficit meal plan, which promotes 1-2 lb weight loss per week and includes a negative energy balance of [phone] kcal/d;Understanding recommendations for meals to include 15-35% energy as protein, 25-35% energy from fat, 35-60% energy from carbohydrates, less than 200mg  of dietary cholesterol, 20-35 gm of total fiber daily;Understanding of distribution of calorie intake throughout the day with the consumption of 4-5 meals/snacks    Tobacco Cessation Yes    Number of packs per day Michille is a current tobacco user. Intervention for  tobacco cessation was provided at the initial medical review. Shewas asked about readiness to quit and reported she has cut back to 1/2 pack a day from 1 1/2 and is using patches to aid in quitting. She was also interested in joining a smoking cessation class, for which she was given information. Patient was advised and educated about tobacco cessation using combination therapy, tobacco cessation classes, quit line, and quit smoking apps. Patient demonstrated understanding of this material. Staff will continue to provide encouragement and follow up with the patient throughout the program.    Intervention Assist the participant in steps to quit. Provide individualized education and counseling about committing to Tobacco Cessation, relapse prevention, and pharmacological support that can be provided by physician.;Education officer, environmental, assist with locating and accessing local/national Quit Smoking programs, and support quit date choice.    Expected Outcomes Short Term: Will quit all tobacco product use, adhering to prevention of relapse plan.;Long Term: Complete abstinence from all tobacco products for at least 12 months from quit date.    Diabetes Yes    Intervention Provide education about signs/symptoms and action to take for hypo/hyperglycemia.;Provide education about proper nutrition, including hydration, and aerobic/resistive exercise prescription along with prescribed medications to achieve blood glucose in normal ranges: Fasting glucose 65-99 mg/dL    Expected Outcomes Short Term: Participant verbalizes understanding of the signs/symptoms and immediate care of hyper/hypoglycemia, proper foot care and importance of medication, aerobic/resistive exercise and nutrition plan for blood glucose control.;Long Term: Attainment of HbA1C < 7%.    Hypertension Yes    Intervention Provide education on lifestyle modifcations including regular physical activity/exercise, weight management, moderate sodium  restriction and increased consumption of fresh fruit, vegetables, and low fat dairy, alcohol moderation, and smoking cessation.;Monitor prescription use compliance.    Expected Outcomes Short Term: Continued assessment and intervention until BP is < 140/62mm HG in hypertensive participants. < 130/62mm HG in hypertensive participants with diabetes, heart failure or chronic kidney disease.;Long Term: Maintenance of blood pressure at goal levels.    Lipids Yes    Intervention Provide education and support for participant on nutrition & aerobic/resistive exercise along with prescribed medications to achieve LDL 70mg , HDL >40mg .    Expected Outcomes Short Term: Participant states understanding of desired cholesterol values and is compliant with medications prescribed. Participant is following exercise prescription and nutrition guidelines.;Long Term: Cholesterol controlled with medications as prescribed, with individualized exercise RX and with personalized nutrition plan. Value goals: LDL < 70mg , HDL > 40 mg.  Education:Diabetes - Individual verbal and written instruction to review signs/symptoms of diabetes, desired ranges of glucose level fasting, after meals and with exercise. Acknowledge that pre and post exercise glucose checks will be done for 3 sessions at entry of program. Flowsheet Row Cardiac Rehab from 03/28/2023 in [facility] Cardiac and Pulmonary Rehab  Date 03/28/23  Educator [facility]  Instruction Review Code 1- Verbalizes Understanding       Core Components/Risk Factors/Patient Goals Review:    Core Components/Risk Factors/Patient Goals at Discharge (Final Review):    ITP Comments:  ITP Comments     Row Name 03/10/23 1634 03/28/23 1712 04/04/23 1529 04/06/23 1109     ITP Comments Initial phone call completed. Dx can be found in [facility] 6/12. EP orientation scheduled for 7/29 at 3:00 pm. Completed and gym orientation. Initial ITP created and sent for review to Dr. Bethann Punches, Medical Director.Judy is a current tobacco user. Intervention for tobacco cessation was provided at the initial medical review. Shewas asked about readiness to quit and reported she has cut back to 1/2 pack a day from 1 1/2 and is using patches to aid in quitting. She was also interested in joining a smoking cessation class, for which she was given information. Patient was advised and educated about tobacco cessation using combination therapy, tobacco cessation classes, quit line, and quit smoking apps. Patient demonstrated understanding of this material. Staff will continue to provide encouragement and follow up with the patient throughout the program. First full day of exercise!  Patient was oriented to gym and equipment including functions, settings, policies, and procedures.  Patient's individual exercise prescription and treatment plan were reviewed.  All starting workloads were established based on the results of the 6 minute walk test done at initial orientation visit.  The plan for exercise progression was also introduced and progression will be customized based on patient's performance and goals. 30 Day review completed. Medical Director ITP review done, changes made as directed, and signed approval by Medical Director.   new to program             Comments:

## 2023-04-13 ENCOUNTER — Encounter: Payer: Self-pay | Admitting: *Deleted

## 2023-04-13 NOTE — Progress Notes (Signed)
Lauren Whitney called to inform staff that her work schedule has changed and she is unable to make her appointments at this time. She is working with her employer to figure out her schedule. She will call us back at the end of the month to let us know when she can come back or if she needs to discharge

## 2023-05-04 ENCOUNTER — Encounter: Payer: Self-pay | Admitting: *Deleted

## 2023-05-04 DIAGNOSIS — Z955 Presence of coronary angioplasty implant and graft: Secondary | ICD-10-CM

## 2023-05-04 DIAGNOSIS — I213 ST elevation (STEMI) myocardial infarction of unspecified site: Secondary | ICD-10-CM

## 2023-05-04 NOTE — Progress Notes (Signed)
Cardiac Individual Treatment Plan  Patient Details  Name: Lauren Whitney MRN: [id number] Date of Birth: 01-Dec-1965 Referring Provider:   Flowsheet Row Cardiac Rehab from 03/28/2023 in [facility] Cardiac and Pulmonary Rehab  Referring Provider Paraschos       Initial Encounter Date:  Flowsheet Row Cardiac Rehab from 03/28/2023 in [facility] Cardiac and Pulmonary Rehab  Date 03/28/23       Visit Diagnosis: ST elevation myocardial infarction (STEMI), unspecified artery [facility])  Status post coronary artery stent placement  Patient's Home Medications on Admission:  Current Outpatient Medications:    acetaminophen (TYLENOL) 500 MG tablet, Take 1,000 mg by mouth every 6 (six) hours as needed (for pain.)., Disp: , Rfl:    atorvastatin (LIPITOR) 80 MG tablet, Take 1 tablet (80 mg total) by mouth daily., Disp: 30 tablet, Rfl: 0   Blood Glucose Monitoring Suppl KIT, 1 kit by Does not apply route daily as needed., Disp: 1 kit, Rfl: 0   Continuous Blood Gluc Sensor (FREESTYLE LIBRE 2 SENSOR) MISC, USE 1 KIT EVERY 14 (FOURTEEN) DAYS FOR GLUCOSE MONITORING, Disp: , Rfl:    dicyclomine (BENTYL) 10 MG capsule, Take 1 capsule (10 mg total) by mouth 3 (three) times daily as needed for spasms., Disp: 30 capsule, Rfl: 0   metFORMIN (GLUCOPHAGE-XR) 500 MG 24 hr tablet, Take 2,000 mg by mouth every evening., Disp: , Rfl:    metoprolol succinate (TOPROL-XL) 25 MG 24 hr tablet, Take 0.5 tablets (12.5 mg total) by mouth daily., Disp: 15 tablet, Rfl: 0   nicotine (NICODERM CQ - DOSED IN MG/24 HOURS) 14 mg/24hr patch, Place 1 patch (14 mg total) onto the skin daily., Disp: 28 patch, Rfl: 0   nitroGLYCERIN (NITROSTAT) 0.4 MG SL tablet, Place 1 tablet (0.4 mg total) under the tongue every 5 (five) minutes x 3 doses as needed for chest pain., Disp: 100 tablet, Rfl: 0  Past Medical History: Past Medical History:  Diagnosis Date   Cholelithiases    Diabetes (HCC)    GERD (gastroesophageal reflux disease)    due to  gallbladder-no meds   Hypertension    off meds x 2 years-better control now    Tobacco Use: Social History   Tobacco Use  Smoking Status Every Day   Current packs/day: 1.00   Average packs/day: 1 pack/day for 30.0 years (30.0 ttl pk-yrs)   Types: Cigarettes  Smokeless Tobacco Never    Labs: Review Flowsheet       Latest Ref Rng & Units 04/07/2021 06/07/2022 02/09/2023  Labs for ITP Cardiac and Pulmonary Rehab  Cholestrol 0 - 200 mg/dL - - 409   LDL (calc) 0 - 99 mg/dL - - 811   HDL-C >91 mg/dL - - 34   Trlycerides <478 mg/dL - - 295   Hemoglobin A2Z 4.8 - 5.6 % 10.8  8.1  12.6     Details             Exercise Target Goals: Exercise Program Goal: Individual exercise prescription set using results from initial 6 min walk test and THRR while considering  patient's activity barriers and safety.   Exercise Prescription Goal: Initial exercise prescription builds to 30-45 minutes a day of aerobic activity, 2-3 days per week.  Home exercise guidelines will be given to patient during program as part of exercise prescription that the participant will acknowledge.   Education: Aerobic Exercise: - Group verbal and visual presentation on the components of exercise prescription. Introduces F.I.T.T principle from ACSM for exercise prescriptions.  Reviews F.I.T.T. principles of aerobic exercise including progression. Written material given at graduation. Flowsheet Row Cardiac Rehab from 03/28/2023 in [facility] Cardiac and Pulmonary Rehab  Education need identified 03/28/23       Education: Resistance Exercise: - Group verbal and visual presentation on the components of exercise prescription. Introduces F.I.T.T principle from ACSM for exercise prescriptions  Reviews F.I.T.T. principles of resistance exercise including progression. Written material given at graduation.    Education: Exercise & Equipment Safety: - Individual verbal instruction and demonstration of equipment use and safety  with use of the equipment. Flowsheet Row Cardiac Rehab from 03/28/2023 in [facility] Cardiac and Pulmonary Rehab  Date 03/28/23  Educator [facility]  Instruction Review Code 1- Verbalizes Understanding       Education: Exercise Physiology & General Exercise Guidelines: - Group verbal and written instruction with models to review the exercise physiology of the cardiovascular system and associated critical values. Provides general exercise guidelines with specific guidelines to those with heart or lung disease.  Flowsheet Row Cardiac Rehab from 03/28/2023 in [facility] Cardiac and Pulmonary Rehab  Education need identified 03/28/23       Education: Flexibility, Balance, Mind/Body Relaxation: - Group verbal and visual presentation with interactive activity on the components of exercise prescription. Introduces F.I.T.T principle from ACSM for exercise prescriptions. Reviews F.I.T.T. principles of flexibility and balance exercise training including progression. Also discusses the mind body connection.  Reviews various relaxation techniques to help reduce and manage stress (i.e. Deep breathing, progressive muscle relaxation, and visualization). Balance handout provided to take home. Written material given at graduation.   Activity Barriers & Risk Stratification:  Activity Barriers & Cardiac Risk Stratification - 03/28/23 1715       Activity Barriers & Cardiac Risk Stratification   Activity Barriers Other (comment)    Comments Neuropathy    Cardiac Risk Stratification High             6 Minute Walk:  6 Minute Walk     Row Name 03/28/23 1714         6 Minute Walk   Phase Initial     Distance 1245 feet     Walk Time 6 minutes     # of Rest Breaks 0     MPH 2.36     METS 3.3     RPE 11     Perceived Dyspnea  0     VO2 Peak 11.57     Symptoms No     Resting HR 74 bpm     Resting BP 122/80     Resting Oxygen Saturation  96 %     Exercise Oxygen Saturation  during 6 min walk 98 %     Max Ex.  HR 105 bpm     Max Ex. BP 130/70     2 Minute Post BP 132/80              Oxygen Initial Assessment:   Oxygen Re-Evaluation:   Oxygen Discharge (Final Oxygen Re-Evaluation):   Initial Exercise Prescription:  Initial Exercise Prescription - 03/28/23 1700       Date of Initial Exercise RX and Referring Provider   Date 03/28/23    Referring Provider Paraschos      Oxygen   Maintain Oxygen Saturation 88% or higher      Treadmill   MPH 2.5    Grade 1    Minutes 15    METs 3.26      Recumbant Bike   Level  2    RPM 50    Watts 20    Minutes 15      Elliptical   Level 1    Speed 3    Minutes 15    METs 3.3      REL-XR   Level 2    Speed 50    Minutes 15    METs 3.3      Track   Laps 42    Minutes 15    METs 3.28      Prescription Details   Frequency (times per week) 3    Duration Progress to 30 minutes of continuous aerobic without signs/symptoms of physical distress      Intensity   THRR 40-80% of Max Heartrate 109-145    Ratings of Perceived Exertion 11-13    Perceived Dyspnea 0-4      Progression   Progression Continue to progress workloads to maintain intensity without signs/symptoms of physical distress.      Resistance Training   Training Prescription Yes    Weight 3    Reps 10-15             Perform Capillary Blood Glucose checks as needed.  Exercise Prescription Changes:   Exercise Prescription Changes     Row Name 03/28/23 1700 04/19/23 1300           Response to Exercise   Blood Pressure (Admit) 122/80 122/62      Blood Pressure (Exercise) 130/70 142/66      Blood Pressure (Exit) 120/70 124/72      Heart Rate (Admit) 74 bpm 65 bpm      Heart Rate (Exercise) 105 bpm 109 bpm      Heart Rate (Exit) 89 bpm 80 bpm      Oxygen Saturation (Admit) 96 % --      Oxygen Saturation (Exercise) 98 % --      Oxygen Saturation (Exit) 98 % --      Rating of Perceived Exertion (Exercise) 11 11      Perceived Dyspnea (Exercise) 0  --      Symptoms none --      Comments results --        Progression   Progression -- Continue to progress workloads to maintain intensity without signs/symptoms of physical distress.      Average METs -- 3.1        Resistance Training   Training Prescription -- Yes      Weight -- 3      Reps -- 10-15        Treadmill   MPH -- 2.2      Grade -- 1      Minutes -- 15      METs -- 2.99        NuStep   Level -- 5      SPM -- 80      Minutes -- 15      METs -- 3.2               Exercise Comments:   Exercise Comments     Row Name 04/04/23 1530           Exercise Comments First full day of exercise!  Patient was oriented to gym and equipment including functions, settings, policies, and procedures.  Patient's individual exercise prescription and treatment plan were reviewed.  All starting workloads were established based on the results of the 6 minute walk test done at initial  orientation visit.  The plan for exercise progression was also introduced and progression will be customized based on patient's performance and goals.                Exercise Goals and Review:   Exercise Goals     Row Name 03/28/23 1718             Exercise Goals   Increase Physical Activity Yes       Intervention Provide advice, education, support and counseling about physical activity/exercise needs.;Develop an individualized exercise prescription for aerobic and resistive training based on initial evaluation findings, risk stratification, comorbidities and participant's personal goals.       Expected Outcomes Short Term: Attend rehab on a regular basis to increase amount of physical activity.;Long Term: Add in home exercise to make exercise part of routine and to increase amount of physical activity.;Long Term: Exercising regularly at least 3-5 days a week.       Increase Strength and Stamina Yes       Intervention Provide advice, education, support and counseling about physical  activity/exercise needs.;Develop an individualized exercise prescription for aerobic and resistive training based on initial evaluation findings, risk stratification, comorbidities and participant's personal goals.       Expected Outcomes Short Term: Increase workloads from initial exercise prescription for resistance, speed, and METs.;Short Term: Perform resistance training exercises routinely during rehab and add in resistance training at home;Long Term: Improve cardiorespiratory fitness, muscular endurance and strength as measured by increased METs and functional capacity ( )       Able to understand and use rate of perceived exertion (RPE) scale Yes       Intervention Provide education and explanation on how to use RPE scale       Expected Outcomes Short Term: Able to use RPE daily in rehab to express subjective intensity level;Long Term:  Able to use RPE to guide intensity level when exercising independently       Able to understand and use Dyspnea scale Yes       Intervention Provide education and explanation on how to use Dyspnea scale       Expected Outcomes Short Term: Able to use Dyspnea scale daily in rehab to express subjective sense of shortness of breath during exertion;Long Term: Able to use Dyspnea scale to guide intensity level when exercising independently       Knowledge and understanding of Target Heart Rate Range (THRR) Yes       Intervention Provide education and explanation of THRR including how the numbers were predicted and where they are located for reference       Expected Outcomes Short Term: Able to state/look up THRR;Long Term: Able to use THRR to govern intensity when exercising independently;Short Term: Able to use daily as guideline for intensity in rehab       Able to check pulse independently Yes       Intervention Provide education and demonstration on how to check pulse in carotid and radial arteries.;Review the importance of being able to check your own pulse for  safety during independent exercise       Expected Outcomes Short Term: Able to explain why pulse checking is important during independent exercise;Long Term: Able to check pulse independently and accurately       Understanding of Exercise Prescription Yes       Intervention Provide education, explanation, and written materials on patient's individual exercise prescription       Expected Outcomes Short  Term: Able to explain program exercise prescription;Long Term: Able to explain home exercise prescription to exercise independently                Exercise Goals Re-Evaluation :  Exercise Goals Re-Evaluation     Row Name 04/04/23 1530 04/19/23 1309           Exercise Goal Re-Evaluation   Exercise Goals Review Increase Physical Activity;Able to understand and use rate of perceived exertion (RPE) scale;Knowledge and understanding of Target Heart Rate Range (THRR);Understanding of Exercise Prescription;Increase Strength and Stamina;Able to check pulse independently Increase Physical Activity;Understanding of Exercise Prescription;Increase Strength and Stamina      Comments Reviewed RPE  and dyspnea scale, THR and program prescription with pt today.  Pt voiced understanding and was given a copy of goals to take home. Donnel has completed 2 sessions, and has not been here since 8/5. Her work schedule changed and her appointment time does not work, and she will call back when she knows if she can continue. We hope she will continue her progress in the program.      Expected Outcomes Short: Use RPE daily to regulate intensity.  Long: Follow program prescription in THR. Short: Return to regular attendance in the program. Long: Continue to follow current exercise prescription upon return.               Discharge Exercise Prescription (Final Exercise Prescription Changes):  Exercise Prescription Changes - 04/19/23 1300       Response to Exercise   Blood Pressure (Admit) 122/62    Blood  Pressure (Exercise) 142/66    Blood Pressure (Exit) 124/72    Heart Rate (Admit) 65 bpm    Heart Rate (Exercise) 109 bpm    Heart Rate (Exit) 80 bpm    Rating of Perceived Exertion (Exercise) 11      Progression   Progression Continue to progress workloads to maintain intensity without signs/symptoms of physical distress.    Average METs 3.1      Resistance Training   Training Prescription Yes    Weight 3    Reps 10-15      Treadmill   MPH 2.2    Grade 1    Minutes 15    METs 2.99      NuStep   Level 5    SPM 80    Minutes 15    METs 3.2             Nutrition:  Target Goals: Understanding of nutrition guidelines, daily intake of sodium 1500mg , cholesterol 200mg , calories 30% from fat and 7% or less from saturated fats, daily to have 5 or more servings of fruits and vegetables.  Education: All About Nutrition: -Group instruction provided by verbal, written material, interactive activities, discussions, models, and posters to present general guidelines for heart healthy nutrition including fat, fiber, MyPlate, the role of sodium in heart healthy nutrition, utilization of the nutrition label, and utilization of this knowledge for meal planning. Follow up email sent as well. Written material given at graduation. Flowsheet Row Cardiac Rehab from 03/28/2023 in [facility] Cardiac and Pulmonary Rehab  Education need identified 03/28/23       Biometrics:  Pre Biometrics - 03/28/23 1719       Pre Biometrics   Height 5' 5.5" (1.664 m)    Weight 191 lb 14.4 oz (87 kg)    Waist Circumference 42 inches    Hip Circumference 44 inches    Waist to Hip  Ratio 0.95 %    BMI (Calculated) 31.44    Single Leg Stand 21.22 seconds              Nutrition Therapy Plan and Nutrition Goals:  Nutrition Therapy & Goals - 03/28/23 1708       Nutrition Therapy   Diet Carb controlled, Cardiac, Low Na    Protein (specify units) 90    Fiber 25 grams    Whole Grain Foods 3 servings     Saturated Fats 15 max. grams    Fruits and Vegetables 5 servings/day    Sodium 2 grams      Personal Nutrition Goals   Nutrition Goal Drink ~2 bottles of water per day (cut back on coffee)    Personal Goal #2 Be mindful of carbs per meal (30-60gCarbs)    Personal Goal #3 Pair a complex carb with a protein or healthy fat    Comments Patient drinking ~1bottle of water per day, drinks lots of black coffee and unsweet tea. Talked to her about caffeine and hydration. She reports she could do better with hydration, says she is thirsty often. Set goal to drink more water ~2 bottles per day. She has T2DM with a A1C of 12.6%. doesn't count carbs. Provided carb counting handout, reviewed with her and wrote out carb per meal goal, gave examples and answered questions. Reviewed mediterranean diet handout, focus on reducing red meat, increasing colorful produce and healthy fats. Educated on sodium recommendation and how to read labels. Built out several meals and snacks with foods she likes and will eat. Focus on carb controlled portions paired with lean protein and healthy fats. Encouraged larger portions on low carb veggies when eating out. Reviewed a few good choices at some of her favorite restaurants to help her feel confident when eating out. Showed her a few healthier frozen meals, what to look for on labels and encouraged her to cook small simple meals as a starting point to wean away from fast food.      Intervention Plan   Intervention Prescribe, educate and counsel regarding individualized specific dietary modifications aiming towards targeted core components such as weight, hypertension, lipid management, diabetes, heart failure and other comorbidities.;Nutrition handout(s) given to patient.    Expected Outcomes Short Term Goal: Understand basic principles of dietary content, such as calories, fat, sodium, cholesterol and nutrients.;Short Term Goal: A plan has been developed with personal nutrition goals  set during dietitian appointment.;Long Term Goal: Adherence to prescribed nutrition plan.             Nutrition Assessments:  MEDIFICTS Score Key: ?70 Need to make dietary changes  40-70 Heart Healthy Diet ? 40 Therapeutic Level Cholesterol Diet  Flowsheet Row Cardiac Rehab from 03/28/2023 in [facility] Cardiac and Pulmonary Rehab  Picture Your Plate Total Score on Admission 40      Picture Your Plate Scores: <60 Unhealthy dietary pattern with much room for improvement. 41-50 Dietary pattern unlikely to meet recommendations for good health and room for improvement. 51-60 More healthful dietary pattern, with some room for improvement.  >60 Healthy dietary pattern, although there may be some specific behaviors that could be improved.    Nutrition Goals Re-Evaluation:   Nutrition Goals Discharge (Final Nutrition Goals Re-Evaluation):   Psychosocial: Target Goals: Acknowledge presence or absence of significant depression and/or stress, maximize coping skills, provide positive support system. Participant is able to verbalize types and ability to use techniques and skills needed for reducing stress and depression.  Education: Stress, Anxiety, and Depression - Group verbal and visual presentation to define topics covered.  Reviews how body is impacted by stress, anxiety, and depression.  Also discusses healthy ways to reduce stress and to treat/manage anxiety and depression.  Written material given at graduation.   Education: Sleep Hygiene -Provides group verbal and written instruction about how sleep can affect your health.  Define sleep hygiene, discuss sleep cycles and impact of sleep habits. Review good sleep hygiene tips.    Initial Review & Psychosocial Screening:  Initial Psych Review & Screening - 03/10/23 1608       Initial Review   Current issues with History of Depression   "some depression but my husband is wonderful support and I don't have an issue with it now"      Family Dynamics   Good Support System? Yes   Lives with husband who is great support; also has grown son that is great support     Barriers   Psychosocial barriers to participate in program The patient should benefit from training in stress management and relaxation.      Screening Interventions   Interventions Encouraged to exercise;To provide support and resources with identified psychosocial needs    Expected Outcomes Short Term goal: Utilizing psychosocial counselor, staff and physician to assist with identification of specific Stressors or current issues interfering with healing process. Setting desired goal for each stressor or current issue identified.;Long Term Goal: Stressors or current issues are controlled or eliminated.             Quality of Life Scores:   Quality of Life - 03/28/23 1724       Quality of Life   Select Quality of Life      Quality of Life Scores   Health/Function Pre 16.53 %    Socioeconomic Pre 21.06 %    Psych/Spiritual Pre 21.71 %    Family Pre 25.2 %    GLOBAL Pre 19.84 %            Scores of 19 and below usually indicate a poorer quality of life in these areas.  A difference of  2-3 points is a clinically meaningful difference.  A difference of 2-3 points in the total score of the Quality of Life Index has been associated with significant improvement in overall quality of life, self-image, physical symptoms, and general health in studies assessing change in quality of life.  PHQ-9: Review Flowsheet       03/28/2023  Depression screen PHQ 2/9  Decreased Interest 0  Down, Depressed, Hopeless 1  PHQ - 2 Score 1  Altered sleeping 1  Tired, decreased energy 1  Change in appetite 0  Feeling bad or failure about yourself  1  Trouble concentrating 0  Moving slowly or fidgety/restless 0  Suicidal thoughts 0  PHQ-9 Score 4  Difficult doing work/chores Not difficult at all    Details           Interpretation of Total Score  Total  Score Depression Severity:  1-4 = Minimal depression, 5-9 = Mild depression, 10-14 = Moderate depression, 15-19 = Moderately severe depression, 20-27 = Severe depression   Psychosocial Evaluation and Intervention:  Psychosocial Evaluation - 03/10/23 1613       Psychosocial Evaluation & Interventions   Interventions Encouraged to exercise with the program and follow exercise prescription;Relaxation education;Stress management education    Comments Gracy is coming to cardiac rehab post STEMI and stent placement. She is feeling back  to baseline and ready to begin cardiac rehab.  Goretti also had carotid stent placed 04/2022. She has 30+ yr tobacco history; she is ready to quit and has started nicotine patches with great success by reducing from 1.5 pk to 6 cigarettes/day. Gwenetta has requested additional tobacco cessation material when she arrives to rehab. Williemae has type 2 diabetes controlled by Metformin and insulin. She says it has recently regulated and is well controlled in the past couple weeks. Rhilee reports having neuropathy in both feet and has started wearing diabetic socks that have helped. She lives with her husband and also has a grown son that is local, both are great support! Rashana has no barriers for attending the program and is excited to start rehab.    Expected Outcomes Short: Attend cardiac rehab for education and exercise  Long: Develop and maintain positive self care habits    Continue Psychosocial Services  Follow up required by staff             Psychosocial Re-Evaluation:   Psychosocial Discharge (Final Psychosocial Re-Evaluation):   Vocational Rehabilitation: Provide vocational rehab assistance to qualifying candidates.   Vocational Rehab Evaluation & Intervention:  Vocational Rehab - 03/10/23 1611       Initial Vocational Rehab Evaluation & Intervention   Assessment shows need for Vocational Rehabilitation No             Education: Education Goals:  Education classes will be provided on a variety of topics geared toward better understanding of heart health and risk factor modification. Participant will state understanding/return demonstration of topics presented as noted by education test scores.  Learning Barriers/Preferences:  Learning Barriers/Preferences - 03/10/23 1611       Learning Barriers/Preferences   Learning Barriers None    Learning Preferences None             General Cardiac Education Topics:  AED/CPR: - Group verbal and written instruction with the use of models to demonstrate the basic use of the AED with the basic ABC's of resuscitation.   Anatomy and Cardiac Procedures: - Group verbal and visual presentation and models provide information about basic cardiac anatomy and function. Reviews the testing methods done to diagnose heart disease and the outcomes of the test results. Describes the treatment choices: Medical Management, Angioplasty, or Coronary [facility] for treating various heart conditions including Myocardial Infarction, Angina, Valve Disease, and Cardiac Arrhythmias.  Written material given at graduation. Flowsheet Row Cardiac Rehab from 03/28/2023 in [facility] Cardiac and Pulmonary Rehab  Education need identified 03/28/23       Medication Safety: - Group verbal and visual instruction to review commonly prescribed medications for heart and lung disease. Reviews the medication, class of the drug, and side effects. Includes the steps to properly store meds and maintain the prescription regimen.  Written material given at graduation.   Intimacy: - Group verbal instruction through game format to discuss how heart and lung disease can affect sexual intimacy. Written material given at graduation..   Know Your Numbers and Heart Failure: - Group verbal and visual instruction to discuss disease risk factors for cardiac and pulmonary disease and treatment options.  Reviews associated critical values for  Overweight/Obesity, Hypertension, Cholesterol, and Diabetes.  Discusses basics of heart failure: signs/symptoms and treatments.  Introduces Heart Failure Zone chart for action plan for heart failure.  Written material given at graduation.   Infection Prevention: - Provides verbal and written material to individual with discussion of infection control including proper hand  washing and proper equipment cleaning during exercise session. Flowsheet Row Cardiac Rehab from 03/28/2023 in [facility] Cardiac and Pulmonary Rehab  Date 03/28/23  Educator [facility]  Instruction Review Code 1- Verbalizes Understanding       Falls Prevention: - Provides verbal and written material to individual with discussion of falls prevention and safety. Flowsheet Row Cardiac Rehab from 03/28/2023 in [facility] Cardiac and Pulmonary Rehab  Date 03/28/23  Educator [facility]  Instruction Review Code 1- Verbalizes Understanding       Other: -Provides group and verbal instruction on various topics (see comments)   Knowledge Questionnaire Score:  Knowledge Questionnaire Score - 03/28/23 1723       Knowledge Questionnaire Score   Pre Score 22/26             Core Components/Risk Factors/Patient Goals at Admission:  Personal Goals and Risk Factors at Admission - 03/28/23 1725       Core Components/Risk Factors/Patient Goals on Admission    Weight Management Yes    Intervention Weight Management: Develop a combined nutrition and exercise program designed to reach desired caloric intake, while maintaining appropriate intake of nutrient and fiber, sodium and fats, and appropriate energy expenditure required for the weight goal.;Weight Management: Provide education and appropriate resources to help participant work on and attain dietary goals.;Weight Management/Obesity: Establish reasonable short term and long term weight goals.    Admit Weight 191 lb 14.4 oz (87 kg)    Goal Weight: Short Term 185 lb (83.9 kg)    Goal Weight: Long  Term 145 lb (65.8 kg)    Expected Outcomes Short Term: Continue to assess and modify interventions until short term weight is achieved;Long Term: Adherence to nutrition and physical activity/exercise program aimed toward attainment of established weight goal;Weight Maintenance: Understanding of the daily nutrition guidelines, which includes 25-35% calories from fat, 7% or less cal from saturated fats, less than 200mg  cholesterol, less than 1.5gm of sodium, & 5 or more servings of fruits and vegetables daily;Weight Loss: Understanding of general recommendations for a balanced deficit meal plan, which promotes 1-2 lb weight loss per week and includes a negative energy balance of [phone] kcal/d;Understanding recommendations for meals to include 15-35% energy as protein, 25-35% energy from fat, 35-60% energy from carbohydrates, less than 200mg  of dietary cholesterol, 20-35 gm of total fiber daily;Understanding of distribution of calorie intake throughout the day with the consumption of 4-5 meals/snacks    Tobacco Cessation Yes    Number of packs per day Daveigh is a current tobacco user. Intervention for tobacco cessation was provided at the initial medical review. Shewas asked about readiness to quit and reported she has cut back to 1/2 pack a day from 1 1/2 and is using patches to aid in quitting. She was also interested in joining a smoking cessation class, for which she was given information. Patient was advised and educated about tobacco cessation using combination therapy, tobacco cessation classes, quit line, and quit smoking apps. Patient demonstrated understanding of this material. Staff will continue to provide encouragement and follow up with the patient throughout the program.    Intervention Assist the participant in steps to quit. Provide individualized education and counseling about committing to Tobacco Cessation, relapse prevention, and pharmacological support that can be provided by  physician.;Education officer, environmental, assist with locating and accessing local/national Quit Smoking programs, and support quit date choice.    Expected Outcomes Short Term: Will quit all tobacco product use, adhering to prevention of relapse plan.;Long Term: Complete abstinence  from all tobacco products for at least 12 months from quit date.    Diabetes Yes    Intervention Provide education about signs/symptoms and action to take for hypo/hyperglycemia.;Provide education about proper nutrition, including hydration, and aerobic/resistive exercise prescription along with prescribed medications to achieve blood glucose in normal ranges: Fasting glucose 65-99 mg/dL    Expected Outcomes Short Term: Participant verbalizes understanding of the signs/symptoms and immediate care of hyper/hypoglycemia, proper foot care and importance of medication, aerobic/resistive exercise and nutrition plan for blood glucose control.;Long Term: Attainment of HbA1C < 7%.    Hypertension Yes    Intervention Provide education on lifestyle modifcations including regular physical activity/exercise, weight management, moderate sodium restriction and increased consumption of fresh fruit, vegetables, and low fat dairy, alcohol moderation, and smoking cessation.;Monitor prescription use compliance.    Expected Outcomes Short Term: Continued assessment and intervention until BP is < 140/25mm HG in hypertensive participants. < 130/71mm HG in hypertensive participants with diabetes, heart failure or chronic kidney disease.;Long Term: Maintenance of blood pressure at goal levels.    Lipids Yes    Intervention Provide education and support for participant on nutrition & aerobic/resistive exercise along with prescribed medications to achieve LDL 70mg , HDL >40mg .    Expected Outcomes Short Term: Participant states understanding of desired cholesterol values and is compliant with medications prescribed. Participant is following exercise  prescription and nutrition guidelines.;Long Term: Cholesterol controlled with medications as prescribed, with individualized exercise RX and with personalized nutrition plan. Value goals: LDL < 70mg , HDL > 40 mg.             Education:Diabetes - Individual verbal and written instruction to review signs/symptoms of diabetes, desired ranges of glucose level fasting, after meals and with exercise. Acknowledge that pre and post exercise glucose checks will be done for 3 sessions at entry of program. Flowsheet Row Cardiac Rehab from 03/28/2023 in [facility] Cardiac and Pulmonary Rehab  Date 03/28/23  Educator [facility]  Instruction Review Code 1- Verbalizes Understanding       Core Components/Risk Factors/Patient Goals Review:    Core Components/Risk Factors/Patient Goals at Discharge (Final Review):    ITP Comments:  ITP Comments     Row Name 03/10/23 1634 03/28/23 1712 04/04/23 1529 04/06/23 1109 04/13/23 1408   ITP Comments Initial phone call completed. Dx can be found in [facility] 6/12. EP orientation scheduled for 7/29 at 3:00 pm. Completed and gym orientation. Initial ITP created and sent for review to Dr. Bethann Punches, Medical Director.Ladiamond is a current tobacco user. Intervention for tobacco cessation was provided at the initial medical review. Shewas asked about readiness to quit and reported she has cut back to 1/2 pack a day from 1 1/2 and is using patches to aid in quitting. She was also interested in joining a smoking cessation class, for which she was given information. Patient was advised and educated about tobacco cessation using combination therapy, tobacco cessation classes, quit line, and quit smoking apps. Patient demonstrated understanding of this material. Staff will continue to provide encouragement and follow up with the patient throughout the program. First full day of exercise!  Patient was oriented to gym and equipment including functions, settings, policies, and procedures.   Patient's individual exercise prescription and treatment plan were reviewed.  All starting workloads were established based on the results of the 6 minute walk test done at initial orientation visit.  The plan for exercise progression was also introduced and progression will be customized based on patient's performance and  goals. 30 Day review completed. Medical Director ITP review done, changes made as directed, and signed approval by Medical Director.   new to program Chantice called to inform staff that her work schedule has changed and she is unable to  make her appointments at this time. She is working with her employer to figure out her schedule. She will call us back at the end of the month to let us know when she can come back or if she needs to discharge    Row Name 05/04/23 0914           ITP Comments 30 Day review completed. Medical Director ITP review done, changes made as directed, and signed approval by Medical Director.                Comments:

## 2023-05-05 ENCOUNTER — Telehealth: Payer: Self-pay | Admitting: *Deleted

## 2023-05-05 ENCOUNTER — Encounter: Payer: Self-pay | Admitting: *Deleted

## 2023-05-05 DIAGNOSIS — Z955 Presence of coronary angioplasty implant and graft: Secondary | ICD-10-CM

## 2023-05-05 DIAGNOSIS — I213 ST elevation (STEMI) myocardial infarction of unspecified site: Secondary | ICD-10-CM

## 2023-05-05 NOTE — Telephone Encounter (Signed)
Called to follow up with pt. She has not been to rehab since 8/5. Alethia said that her work schedule is not going to allow her to attend rehab. She thought she could make it work, but will not be able to moving forward. Pt would like to discharge from the program.

## 2023-05-05 NOTE — Progress Notes (Signed)
Cardiac Individual Treatment Plan  Patient Details  Name: Lauren Whitney MRN: [id number] Date of Birth: 1966/04/20 Referring Provider:   Flowsheet Row Cardiac Rehab from 03/28/2023 in [facility] Cardiac and Pulmonary Rehab  Referring Provider Paraschos       Initial Encounter Date:  Flowsheet Row Cardiac Rehab from 03/28/2023 in [facility] Cardiac and Pulmonary Rehab  Date 03/28/23       Visit Diagnosis: ST elevation myocardial infarction (STEMI), unspecified artery [facility])  Status post coronary artery stent placement  Patient's Home Medications on Admission:  Current Outpatient Medications:    acetaminophen (TYLENOL) 500 MG tablet, Take 1,000 mg by mouth every 6 (six) hours as needed (for pain.)., Disp: , Rfl:    atorvastatin (LIPITOR) 80 MG tablet, Take 1 tablet (80 mg total) by mouth daily., Disp: 30 tablet, Rfl: 0   Blood Glucose Monitoring Suppl KIT, 1 kit by Does not apply route daily as needed., Disp: 1 kit, Rfl: 0   Continuous Blood Gluc Sensor (FREESTYLE LIBRE 2 SENSOR) MISC, USE 1 KIT EVERY 14 (FOURTEEN) DAYS FOR GLUCOSE MONITORING, Disp: , Rfl:    dicyclomine (BENTYL) 10 MG capsule, Take 1 capsule (10 mg total) by mouth 3 (three) times daily as needed for spasms., Disp: 30 capsule, Rfl: 0   metFORMIN (GLUCOPHAGE-XR) 500 MG 24 hr tablet, Take 2,000 mg by mouth every evening., Disp: , Rfl:    metoprolol succinate (TOPROL-XL) 25 MG 24 hr tablet, Take 0.5 tablets (12.5 mg total) by mouth daily., Disp: 15 tablet, Rfl: 0   nicotine (NICODERM CQ - DOSED IN MG/24 HOURS) 14 mg/24hr patch, Place 1 patch (14 mg total) onto the skin daily., Disp: 28 patch, Rfl: 0   nitroGLYCERIN (NITROSTAT) 0.4 MG SL tablet, Place 1 tablet (0.4 mg total) under the tongue every 5 (five) minutes x 3 doses as needed for chest pain., Disp: 100 tablet, Rfl: 0  Past Medical History: Past Medical History:  Diagnosis Date   Cholelithiases    Diabetes (HCC)    GERD (gastroesophageal reflux disease)    due to  gallbladder-no meds   Hypertension    off meds x 2 years-better control now    Tobacco Use: Social History   Tobacco Use  Smoking Status Every Day   Current packs/day: 1.00   Average packs/day: 1 pack/day for 30.0 years (30.0 ttl pk-yrs)   Types: Cigarettes  Smokeless Tobacco Never    Labs: Review Flowsheet       Latest Ref Rng & Units 04/07/2021 06/07/2022 02/09/2023  Labs for ITP Cardiac and Pulmonary Rehab  Cholestrol 0 - 200 mg/dL - - 409   LDL (calc) 0 - 99 mg/dL - - 811   HDL-C >91 mg/dL - - 34   Trlycerides <478 mg/dL - - 295   Hemoglobin A2Z 4.8 - 5.6 % 10.8  8.1  12.6     Details             Exercise Target Goals: Exercise Program Goal: Individual exercise prescription set using results from initial 6 min walk test and THRR while considering  patient's activity barriers and safety.   Exercise Prescription Goal: Initial exercise prescription builds to 30-45 minutes a day of aerobic activity, 2-3 days per week.  Home exercise guidelines will be given to patient during program as part of exercise prescription that the participant will acknowledge.   Education: Aerobic Exercise: - Group verbal and visual presentation on the components of exercise prescription. Introduces F.I.T.T principle from ACSM for exercise prescriptions.  Reviews F.I.T.T. principles of aerobic exercise including progression. Written material given at graduation. Flowsheet Row Cardiac Rehab from 03/28/2023 in [facility] Cardiac and Pulmonary Rehab  Education need identified 03/28/23       Education: Resistance Exercise: - Group verbal and visual presentation on the components of exercise prescription. Introduces F.I.T.T principle from ACSM for exercise prescriptions  Reviews F.I.T.T. principles of resistance exercise including progression. Written material given at graduation.    Education: Exercise & Equipment Safety: - Individual verbal instruction and demonstration of equipment use and safety  with use of the equipment. Flowsheet Row Cardiac Rehab from 03/28/2023 in [facility] Cardiac and Pulmonary Rehab  Date 03/28/23  Educator [facility]  Instruction Review Code 1- Verbalizes Understanding       Education: Exercise Physiology & General Exercise Guidelines: - Group verbal and written instruction with models to review the exercise physiology of the cardiovascular system and associated critical values. Provides general exercise guidelines with specific guidelines to those with heart or lung disease.  Flowsheet Row Cardiac Rehab from 03/28/2023 in [facility] Cardiac and Pulmonary Rehab  Education need identified 03/28/23       Education: Flexibility, Balance, Mind/Body Relaxation: - Group verbal and visual presentation with interactive activity on the components of exercise prescription. Introduces F.I.T.T principle from ACSM for exercise prescriptions. Reviews F.I.T.T. principles of flexibility and balance exercise training including progression. Also discusses the mind body connection.  Reviews various relaxation techniques to help reduce and manage stress (i.e. Deep breathing, progressive muscle relaxation, and visualization). Balance handout provided to take home. Written material given at graduation.   Activity Barriers & Risk Stratification:  Activity Barriers & Cardiac Risk Stratification - 03/28/23 1715       Activity Barriers & Cardiac Risk Stratification   Activity Barriers Other (comment)    Comments Neuropathy    Cardiac Risk Stratification High             6 Minute Walk:  6 Minute Walk     Row Name 03/28/23 1714         6 Minute Walk   Phase Initial     Distance 1245 feet     Walk Time 6 minutes     # of Rest Breaks 0     MPH 2.36     METS 3.3     RPE 11     Perceived Dyspnea  0     VO2 Peak 11.57     Symptoms No     Resting HR 74 bpm     Resting BP 122/80     Resting Oxygen Saturation  96 %     Exercise Oxygen Saturation  during 6 min walk 98 %     Max Ex.  HR 105 bpm     Max Ex. BP 130/70     2 Minute Post BP 132/80              Oxygen Initial Assessment:   Oxygen Re-Evaluation:   Oxygen Discharge (Final Oxygen Re-Evaluation):   Initial Exercise Prescription:  Initial Exercise Prescription - 03/28/23 1700       Date of Initial Exercise RX and Referring Provider   Date 03/28/23    Referring Provider Paraschos      Oxygen   Maintain Oxygen Saturation 88% or higher      Treadmill   MPH 2.5    Grade 1    Minutes 15    METs 3.26      Recumbant Bike   Level  2    RPM 50    Watts 20    Minutes 15      Elliptical   Level 1    Speed 3    Minutes 15    METs 3.3      REL-XR   Level 2    Speed 50    Minutes 15    METs 3.3      Track   Laps 42    Minutes 15    METs 3.28      Prescription Details   Frequency (times per week) 3    Duration Progress to 30 minutes of continuous aerobic without signs/symptoms of physical distress      Intensity   THRR 40-80% of Max Heartrate 109-145    Ratings of Perceived Exertion 11-13    Perceived Dyspnea 0-4      Progression   Progression Continue to progress workloads to maintain intensity without signs/symptoms of physical distress.      Resistance Training   Training Prescription Yes    Weight 3    Reps 10-15             Perform Capillary Blood Glucose checks as needed.  Exercise Prescription Changes:   Exercise Prescription Changes     Row Name 03/28/23 1700 04/19/23 1300           Response to Exercise   Blood Pressure (Admit) 122/80 122/62      Blood Pressure (Exercise) 130/70 142/66      Blood Pressure (Exit) 120/70 124/72      Heart Rate (Admit) 74 bpm 65 bpm      Heart Rate (Exercise) 105 bpm 109 bpm      Heart Rate (Exit) 89 bpm 80 bpm      Oxygen Saturation (Admit) 96 % --      Oxygen Saturation (Exercise) 98 % --      Oxygen Saturation (Exit) 98 % --      Rating of Perceived Exertion (Exercise) 11 11      Perceived Dyspnea (Exercise) 0  --      Symptoms none --      Comments results --        Progression   Progression -- Continue to progress workloads to maintain intensity without signs/symptoms of physical distress.      Average METs -- 3.1        Resistance Training   Training Prescription -- Yes      Weight -- 3      Reps -- 10-15        Treadmill   MPH -- 2.2      Grade -- 1      Minutes -- 15      METs -- 2.99        NuStep   Level -- 5      SPM -- 80      Minutes -- 15      METs -- 3.2               Exercise Comments:   Exercise Comments     Row Name 04/04/23 1530           Exercise Comments First full day of exercise!  Patient was oriented to gym and equipment including functions, settings, policies, and procedures.  Patient's individual exercise prescription and treatment plan were reviewed.  All starting workloads were established based on the results of the 6 minute walk test done at initial  orientation visit.  The plan for exercise progression was also introduced and progression will be customized based on patient's performance and goals.                Exercise Goals and Review:   Exercise Goals     Row Name 03/28/23 1718             Exercise Goals   Increase Physical Activity Yes       Intervention Provide advice, education, support and counseling about physical activity/exercise needs.;Develop an individualized exercise prescription for aerobic and resistive training based on initial evaluation findings, risk stratification, comorbidities and participant's personal goals.       Expected Outcomes Short Term: Attend rehab on a regular basis to increase amount of physical activity.;Long Term: Add in home exercise to make exercise part of routine and to increase amount of physical activity.;Long Term: Exercising regularly at least 3-5 days a week.       Increase Strength and Stamina Yes       Intervention Provide advice, education, support and counseling about physical  activity/exercise needs.;Develop an individualized exercise prescription for aerobic and resistive training based on initial evaluation findings, risk stratification, comorbidities and participant's personal goals.       Expected Outcomes Short Term: Increase workloads from initial exercise prescription for resistance, speed, and METs.;Short Term: Perform resistance training exercises routinely during rehab and add in resistance training at home;Long Term: Improve cardiorespiratory fitness, muscular endurance and strength as measured by increased METs and functional capacity ( )       Able to understand and use rate of perceived exertion (RPE) scale Yes       Intervention Provide education and explanation on how to use RPE scale       Expected Outcomes Short Term: Able to use RPE daily in rehab to express subjective intensity level;Long Term:  Able to use RPE to guide intensity level when exercising independently       Able to understand and use Dyspnea scale Yes       Intervention Provide education and explanation on how to use Dyspnea scale       Expected Outcomes Short Term: Able to use Dyspnea scale daily in rehab to express subjective sense of shortness of breath during exertion;Long Term: Able to use Dyspnea scale to guide intensity level when exercising independently       Knowledge and understanding of Target Heart Rate Range (THRR) Yes       Intervention Provide education and explanation of THRR including how the numbers were predicted and where they are located for reference       Expected Outcomes Short Term: Able to state/look up THRR;Long Term: Able to use THRR to govern intensity when exercising independently;Short Term: Able to use daily as guideline for intensity in rehab       Able to check pulse independently Yes       Intervention Provide education and demonstration on how to check pulse in carotid and radial arteries.;Review the importance of being able to check your own pulse for  safety during independent exercise       Expected Outcomes Short Term: Able to explain why pulse checking is important during independent exercise;Long Term: Able to check pulse independently and accurately       Understanding of Exercise Prescription Yes       Intervention Provide education, explanation, and written materials on patient's individual exercise prescription       Expected Outcomes Short  Term: Able to explain program exercise prescription;Long Term: Able to explain home exercise prescription to exercise independently                Exercise Goals Re-Evaluation :  Exercise Goals Re-Evaluation     Row Name 04/04/23 1530 04/19/23 1309           Exercise Goal Re-Evaluation   Exercise Goals Review Increase Physical Activity;Able to understand and use rate of perceived exertion (RPE) scale;Knowledge and understanding of Target Heart Rate Range (THRR);Understanding of Exercise Prescription;Increase Strength and Stamina;Able to check pulse independently Increase Physical Activity;Understanding of Exercise Prescription;Increase Strength and Stamina      Comments Reviewed RPE  and dyspnea scale, THR and program prescription with pt today.  Pt voiced understanding and was given a copy of goals to take home. Kashanda has completed 2 sessions, and has not been here since 8/5. Her work schedule changed and her appointment time does not work, and she will call back when she knows if she can continue. We hope she will continue her progress in the program.      Expected Outcomes Short: Use RPE daily to regulate intensity.  Long: Follow program prescription in THR. Short: Return to regular attendance in the program. Long: Continue to follow current exercise prescription upon return.               Discharge Exercise Prescription (Final Exercise Prescription Changes):  Exercise Prescription Changes - 04/19/23 1300       Response to Exercise   Blood Pressure (Admit) 122/62    Blood  Pressure (Exercise) 142/66    Blood Pressure (Exit) 124/72    Heart Rate (Admit) 65 bpm    Heart Rate (Exercise) 109 bpm    Heart Rate (Exit) 80 bpm    Rating of Perceived Exertion (Exercise) 11      Progression   Progression Continue to progress workloads to maintain intensity without signs/symptoms of physical distress.    Average METs 3.1      Resistance Training   Training Prescription Yes    Weight 3    Reps 10-15      Treadmill   MPH 2.2    Grade 1    Minutes 15    METs 2.99      NuStep   Level 5    SPM 80    Minutes 15    METs 3.2             Nutrition:  Target Goals: Understanding of nutrition guidelines, daily intake of sodium 1500mg , cholesterol 200mg , calories 30% from fat and 7% or less from saturated fats, daily to have 5 or more servings of fruits and vegetables.  Education: All About Nutrition: -Group instruction provided by verbal, written material, interactive activities, discussions, models, and posters to present general guidelines for heart healthy nutrition including fat, fiber, MyPlate, the role of sodium in heart healthy nutrition, utilization of the nutrition label, and utilization of this knowledge for meal planning. Follow up email sent as well. Written material given at graduation. Flowsheet Row Cardiac Rehab from 03/28/2023 in [facility] Cardiac and Pulmonary Rehab  Education need identified 03/28/23       Biometrics:  Pre Biometrics - 03/28/23 1719       Pre Biometrics   Height 5' 5.5" (1.664 m)    Weight 191 lb 14.4 oz (87 kg)    Waist Circumference 42 inches    Hip Circumference 44 inches    Waist to Hip  Ratio 0.95 %    BMI (Calculated) 31.44    Single Leg Stand 21.22 seconds              Nutrition Therapy Plan and Nutrition Goals:  Nutrition Therapy & Goals - 03/28/23 1708       Nutrition Therapy   Diet Carb controlled, Cardiac, Low Na    Protein (specify units) 90    Fiber 25 grams    Whole Grain Foods 3 servings     Saturated Fats 15 max. grams    Fruits and Vegetables 5 servings/day    Sodium 2 grams      Personal Nutrition Goals   Nutrition Goal Drink ~2 bottles of water per day (cut back on coffee)    Personal Goal #2 Be mindful of carbs per meal (30-60gCarbs)    Personal Goal #3 Pair a complex carb with a protein or healthy fat    Comments Patient drinking ~1bottle of water per day, drinks lots of black coffee and unsweet tea. Talked to her about caffeine and hydration. She reports she could do better with hydration, says she is thirsty often. Set goal to drink more water ~2 bottles per day. She has T2DM with a A1C of 12.6%. doesn't count carbs. Provided carb counting handout, reviewed with her and wrote out carb per meal goal, gave examples and answered questions. Reviewed mediterranean diet handout, focus on reducing red meat, increasing colorful produce and healthy fats. Educated on sodium recommendation and how to read labels. Built out several meals and snacks with foods she likes and will eat. Focus on carb controlled portions paired with lean protein and healthy fats. Encouraged larger portions on low carb veggies when eating out. Reviewed a few good choices at some of her favorite restaurants to help her feel confident when eating out. Showed her a few healthier frozen meals, what to look for on labels and encouraged her to cook small simple meals as a starting point to wean away from fast food.      Intervention Plan   Intervention Prescribe, educate and counsel regarding individualized specific dietary modifications aiming towards targeted core components such as weight, hypertension, lipid management, diabetes, heart failure and other comorbidities.;Nutrition handout(s) given to patient.    Expected Outcomes Short Term Goal: Understand basic principles of dietary content, such as calories, fat, sodium, cholesterol and nutrients.;Short Term Goal: A plan has been developed with personal nutrition goals  set during dietitian appointment.;Long Term Goal: Adherence to prescribed nutrition plan.             Nutrition Assessments:  MEDIFICTS Score Key: ?70 Need to make dietary changes  40-70 Heart Healthy Diet ? 40 Therapeutic Level Cholesterol Diet  Flowsheet Row Cardiac Rehab from 03/28/2023 in [facility] Cardiac and Pulmonary Rehab  Picture Your Plate Total Score on Admission 40      Picture Your Plate Scores: <16 Unhealthy dietary pattern with much room for improvement. 41-50 Dietary pattern unlikely to meet recommendations for good health and room for improvement. 51-60 More healthful dietary pattern, with some room for improvement.  >60 Healthy dietary pattern, although there may be some specific behaviors that could be improved.    Nutrition Goals Re-Evaluation:   Nutrition Goals Discharge (Final Nutrition Goals Re-Evaluation):   Psychosocial: Target Goals: Acknowledge presence or absence of significant depression and/or stress, maximize coping skills, provide positive support system. Participant is able to verbalize types and ability to use techniques and skills needed for reducing stress and depression.  Education: Stress, Anxiety, and Depression - Group verbal and visual presentation to define topics covered.  Reviews how body is impacted by stress, anxiety, and depression.  Also discusses healthy ways to reduce stress and to treat/manage anxiety and depression.  Written material given at graduation.   Education: Sleep Hygiene -Provides group verbal and written instruction about how sleep can affect your health.  Define sleep hygiene, discuss sleep cycles and impact of sleep habits. Review good sleep hygiene tips.    Initial Review & Psychosocial Screening:  Initial Psych Review & Screening - 03/10/23 1608       Initial Review   Current issues with History of Depression   "some depression but my husband is wonderful support and I don't have an issue with it now"      Family Dynamics   Good Support System? Yes   Lives with husband who is great support; also has grown son that is great support     Barriers   Psychosocial barriers to participate in program The patient should benefit from training in stress management and relaxation.      Screening Interventions   Interventions Encouraged to exercise;To provide support and resources with identified psychosocial needs    Expected Outcomes Short Term goal: Utilizing psychosocial counselor, staff and physician to assist with identification of specific Stressors or current issues interfering with healing process. Setting desired goal for each stressor or current issue identified.;Long Term Goal: Stressors or current issues are controlled or eliminated.             Quality of Life Scores:   Quality of Life - 03/28/23 1724       Quality of Life   Select Quality of Life      Quality of Life Scores   Health/Function Pre 16.53 %    Socioeconomic Pre 21.06 %    Psych/Spiritual Pre 21.71 %    Family Pre 25.2 %    GLOBAL Pre 19.84 %            Scores of 19 and below usually indicate a poorer quality of life in these areas.  A difference of  2-3 points is a clinically meaningful difference.  A difference of 2-3 points in the total score of the Quality of Life Index has been associated with significant improvement in overall quality of life, self-image, physical symptoms, and general health in studies assessing change in quality of life.  PHQ-9: Review Flowsheet       03/28/2023  Depression screen PHQ 2/9  Decreased Interest 0  Down, Depressed, Hopeless 1  PHQ - 2 Score 1  Altered sleeping 1  Tired, decreased energy 1  Change in appetite 0  Feeling bad or failure about yourself  1  Trouble concentrating 0  Moving slowly or fidgety/restless 0  Suicidal thoughts 0  PHQ-9 Score 4  Difficult doing work/chores Not difficult at all    Details           Interpretation of Total Score  Total  Score Depression Severity:  1-4 = Minimal depression, 5-9 = Mild depression, 10-14 = Moderate depression, 15-19 = Moderately severe depression, 20-27 = Severe depression   Psychosocial Evaluation and Intervention:  Psychosocial Evaluation - 03/10/23 1613       Psychosocial Evaluation & Interventions   Interventions Encouraged to exercise with the program and follow exercise prescription;Relaxation education;Stress management education    Comments Alyeska is coming to cardiac rehab post STEMI and stent placement. She is feeling back  to baseline and ready to begin cardiac rehab.  Meddie also had carotid stent placed 04/2022. She has 30+ yr tobacco history; she is ready to quit and has started nicotine patches with great success by reducing from 1.5 pk to 6 cigarettes/day. Markita has requested additional tobacco cessation material when she arrives to rehab. Kamesha has type 2 diabetes controlled by Metformin and insulin. She says it has recently regulated and is well controlled in the past couple weeks. Sylva reports having neuropathy in both feet and has started wearing diabetic socks that have helped. She lives with her husband and also has a grown son that is local, both are great support! Mychelle has no barriers for attending the program and is excited to start rehab.    Expected Outcomes Short: Attend cardiac rehab for education and exercise  Long: Develop and maintain positive self care habits    Continue Psychosocial Services  Follow up required by staff             Psychosocial Re-Evaluation:   Psychosocial Discharge (Final Psychosocial Re-Evaluation):   Vocational Rehabilitation: Provide vocational rehab assistance to qualifying candidates.   Vocational Rehab Evaluation & Intervention:  Vocational Rehab - 03/10/23 1611       Initial Vocational Rehab Evaluation & Intervention   Assessment shows need for Vocational Rehabilitation No             Education: Education Goals:  Education classes will be provided on a variety of topics geared toward better understanding of heart health and risk factor modification. Participant will state understanding/return demonstration of topics presented as noted by education test scores.  Learning Barriers/Preferences:  Learning Barriers/Preferences - 03/10/23 1611       Learning Barriers/Preferences   Learning Barriers None    Learning Preferences None             General Cardiac Education Topics:  AED/CPR: - Group verbal and written instruction with the use of models to demonstrate the basic use of the AED with the basic ABC's of resuscitation.   Anatomy and Cardiac Procedures: - Group verbal and visual presentation and models provide information about basic cardiac anatomy and function. Reviews the testing methods done to diagnose heart disease and the outcomes of the test results. Describes the treatment choices: Medical Management, Angioplasty, or Coronary Bypass Surgery for treating various heart conditions including Myocardial Infarction, Angina, Valve Disease, and Cardiac Arrhythmias.  Written material given at graduation. Flowsheet Row Cardiac Rehab from 03/28/2023 in [facility] Cardiac and Pulmonary Rehab  Education need identified 03/28/23       Medication Safety: - Group verbal and visual instruction to review commonly prescribed medications for heart and lung disease. Reviews the medication, class of the drug, and side effects. Includes the steps to properly store meds and maintain the prescription regimen.  Written material given at graduation.   Intimacy: - Group verbal instruction through game format to discuss how heart and lung disease can affect sexual intimacy. Written material given at graduation..   Know Your Numbers and Heart Failure: - Group verbal and visual instruction to discuss disease risk factors for cardiac and pulmonary disease and treatment options.  Reviews associated critical values for  Overweight/Obesity, Hypertension, Cholesterol, and Diabetes.  Discusses basics of heart failure: signs/symptoms and treatments.  Introduces Heart Failure Zone chart for action plan for heart failure.  Written material given at graduation.   Infection Prevention: - Provides verbal and written material to individual with discussion of infection control including proper hand  washing and proper equipment cleaning during exercise session. Flowsheet Row Cardiac Rehab from 03/28/2023 in [facility] Cardiac and Pulmonary Rehab  Date 03/28/23  Educator [facility]  Instruction Review Code 1- Verbalizes Understanding       Falls Prevention: - Provides verbal and written material to individual with discussion of falls prevention and safety. Flowsheet Row Cardiac Rehab from 03/28/2023 in [facility] Cardiac and Pulmonary Rehab  Date 03/28/23  Educator [facility]  Instruction Review Code 1- Verbalizes Understanding       Other: -Provides group and verbal instruction on various topics (see comments)   Knowledge Questionnaire Score:  Knowledge Questionnaire Score - 03/28/23 1723       Knowledge Questionnaire Score   Pre Score 22/26             Core Components/Risk Factors/Patient Goals at Admission:  Personal Goals and Risk Factors at Admission - 03/28/23 1725       Core Components/Risk Factors/Patient Goals on Admission    Weight Management Yes    Intervention Weight Management: Develop a combined nutrition and exercise program designed to reach desired caloric intake, while maintaining appropriate intake of nutrient and fiber, sodium and fats, and appropriate energy expenditure required for the weight goal.;Weight Management: Provide education and appropriate resources to help participant work on and attain dietary goals.;Weight Management/Obesity: Establish reasonable short term and long term weight goals.    Admit Weight 191 lb 14.4 oz (87 kg)    Goal Weight: Short Term 185 lb (83.9 kg)    Goal Weight: Long  Term 145 lb (65.8 kg)    Expected Outcomes Short Term: Continue to assess and modify interventions until short term weight is achieved;Long Term: Adherence to nutrition and physical activity/exercise program aimed toward attainment of established weight goal;Weight Maintenance: Understanding of the daily nutrition guidelines, which includes 25-35% calories from fat, 7% or less cal from saturated fats, less than 200mg  cholesterol, less than 1.5gm of sodium, & 5 or more servings of fruits and vegetables daily;Weight Loss: Understanding of general recommendations for a balanced deficit meal plan, which promotes 1-2 lb weight loss per week and includes a negative energy balance of [phone] kcal/d;Understanding recommendations for meals to include 15-35% energy as protein, 25-35% energy from fat, 35-60% energy from carbohydrates, less than 200mg  of dietary cholesterol, 20-35 gm of total fiber daily;Understanding of distribution of calorie intake throughout the day with the consumption of 4-5 meals/snacks    Tobacco Cessation Yes    Number of packs per day Dannya is a current tobacco user. Intervention for tobacco cessation was provided at the initial medical review. Shewas asked about readiness to quit and reported she has cut back to 1/2 pack a day from 1 1/2 and is using patches to aid in quitting. She was also interested in joining a smoking cessation class, for which she was given information. Patient was advised and educated about tobacco cessation using combination therapy, tobacco cessation classes, quit line, and quit smoking apps. Patient demonstrated understanding of this material. Staff will continue to provide encouragement and follow up with the patient throughout the program.    Intervention Assist the participant in steps to quit. Provide individualized education and counseling about committing to Tobacco Cessation, relapse prevention, and pharmacological support that can be provided by  physician.;Education officer, environmental, assist with locating and accessing local/national Quit Smoking programs, and support quit date choice.    Expected Outcomes Short Term: Will quit all tobacco product use, adhering to prevention of relapse plan.;Long Term: Complete abstinence  from all tobacco products for at least 12 months from quit date.    Diabetes Yes    Intervention Provide education about signs/symptoms and action to take for hypo/hyperglycemia.;Provide education about proper nutrition, including hydration, and aerobic/resistive exercise prescription along with prescribed medications to achieve blood glucose in normal ranges: Fasting glucose 65-99 mg/dL    Expected Outcomes Short Term: Participant verbalizes understanding of the signs/symptoms and immediate care of hyper/hypoglycemia, proper foot care and importance of medication, aerobic/resistive exercise and nutrition plan for blood glucose control.;Long Term: Attainment of HbA1C < 7%.    Hypertension Yes    Intervention Provide education on lifestyle modifcations including regular physical activity/exercise, weight management, moderate sodium restriction and increased consumption of fresh fruit, vegetables, and low fat dairy, alcohol moderation, and smoking cessation.;Monitor prescription use compliance.    Expected Outcomes Short Term: Continued assessment and intervention until BP is < 140/74mm HG in hypertensive participants. < 130/44mm HG in hypertensive participants with diabetes, heart failure or chronic kidney disease.;Long Term: Maintenance of blood pressure at goal levels.    Lipids Yes    Intervention Provide education and support for participant on nutrition & aerobic/resistive exercise along with prescribed medications to achieve LDL 70mg , HDL >40mg .    Expected Outcomes Short Term: Participant states understanding of desired cholesterol values and is compliant with medications prescribed. Participant is following exercise  prescription and nutrition guidelines.;Long Term: Cholesterol controlled with medications as prescribed, with individualized exercise RX and with personalized nutrition plan. Value goals: LDL < 70mg , HDL > 40 mg.             Education:Diabetes - Individual verbal and written instruction to review signs/symptoms of diabetes, desired ranges of glucose level fasting, after meals and with exercise. Acknowledge that pre and post exercise glucose checks will be done for 3 sessions at entry of program. Flowsheet Row Cardiac Rehab from 03/28/2023 in [facility] Cardiac and Pulmonary Rehab  Date 03/28/23  Educator [facility]  Instruction Review Code 1- Verbalizes Understanding       Core Components/Risk Factors/Patient Goals Review:    Core Components/Risk Factors/Patient Goals at Discharge (Final Review):    ITP Comments:  ITP Comments     Row Name 03/10/23 1634 03/28/23 1712 04/04/23 1529 04/06/23 1109 04/13/23 1408   ITP Comments Initial phone call completed. Dx can be found in [facility] 6/12. EP orientation scheduled for 7/29 at 3:00 pm. Completed and gym orientation. Initial ITP created and sent for review to Dr. Bethann Punches, Medical Director.Jaszmine is a current tobacco user. Intervention for tobacco cessation was provided at the initial medical review. Shewas asked about readiness to quit and reported she has cut back to 1/2 pack a day from 1 1/2 and is using patches to aid in quitting. She was also interested in joining a smoking cessation class, for which she was given information. Patient was advised and educated about tobacco cessation using combination therapy, tobacco cessation classes, quit line, and quit smoking apps. Patient demonstrated understanding of this material. Staff will continue to provide encouragement and follow up with the patient throughout the program. First full day of exercise!  Patient was oriented to gym and equipment including functions, settings, policies, and procedures.   Patient's individual exercise prescription and treatment plan were reviewed.  All starting workloads were established based on the results of the 6 minute walk test done at initial orientation visit.  The plan for exercise progression was also introduced and progression will be customized based on patient's performance and  goals. 30 Day review completed. Medical Director ITP review done, changes made as directed, and signed approval by Medical Director.   new to program Kynzlee called to inform staff that her work schedule has changed and she is unable to  make her appointments at this time. She is working with her employer to figure out her schedule. She will call us back at the end of the month to let us know when she can come back or if she needs to discharge    Row Name 05/04/23 0914 05/05/23 1607         ITP Comments 30 Day review completed. Medical Director ITP review done, changes made as directed, and signed approval by Medical Director. Called to follow up with pt. She has not been to rehab since 8/5. Railee said that her work schedule is not going to allow her to attend rehab. She thought she could make it work, but will not be able to moving forward. Pt would like to discharge from the program.               Comments: Discharge ITP

## 2023-05-05 NOTE — Progress Notes (Signed)
Discharge Summary: Abeera Boden  (DOB: 1966-08-29)  Samoni was unable to complete the program due to her work schedule did not allow her to attend and will discharge early. She completed 3 out of 36 sessions.    6 Minute Walk     Row Name 03/28/23 1714         6 Minute Walk   Phase Initial     Distance 1245 feet     Walk Time 6 minutes     # of Rest Breaks 0     MPH 2.36     METS 3.3     RPE 11     Perceived Dyspnea  0     VO2 Peak 11.57     Symptoms No     Resting HR 74 bpm     Resting BP 122/80     Resting Oxygen Saturation  96 %     Exercise Oxygen Saturation  during 6 min walk 98 %     Max Ex. HR 105 bpm     Max Ex. BP 130/70     2 Minute Post BP 132/80

## 2023-06-01 ENCOUNTER — Other Ambulatory Visit (INDEPENDENT_AMBULATORY_CARE_PROVIDER_SITE_OTHER): Payer: Self-pay | Admitting: Vascular Surgery

## 2023-06-28 ENCOUNTER — Encounter (INDEPENDENT_AMBULATORY_CARE_PROVIDER_SITE_OTHER): Payer: Self-pay | Admitting: Vascular Surgery

## 2023-06-28 ENCOUNTER — Ambulatory Visit (INDEPENDENT_AMBULATORY_CARE_PROVIDER_SITE_OTHER): Payer: Managed Care, Other (non HMO) | Admitting: Vascular Surgery

## 2023-06-28 ENCOUNTER — Ambulatory Visit (INDEPENDENT_AMBULATORY_CARE_PROVIDER_SITE_OTHER): Payer: Managed Care, Other (non HMO)

## 2023-06-28 VITALS — BP 131/64 | HR 75 | Resp 16 | Wt 192.2 lb

## 2023-06-28 DIAGNOSIS — E785 Hyperlipidemia, unspecified: Secondary | ICD-10-CM | POA: Diagnosis not present

## 2023-06-28 DIAGNOSIS — I10 Essential (primary) hypertension: Secondary | ICD-10-CM

## 2023-06-28 DIAGNOSIS — E1165 Type 2 diabetes mellitus with hyperglycemia: Secondary | ICD-10-CM

## 2023-06-28 DIAGNOSIS — I6521 Occlusion and stenosis of right carotid artery: Secondary | ICD-10-CM | POA: Diagnosis not present

## 2023-06-28 DIAGNOSIS — Z794 Long term (current) use of insulin: Secondary | ICD-10-CM

## 2023-06-28 NOTE — Progress Notes (Signed)
MRN : [id number]  Lauren Whitney is a 57 y.o. (06/17/1966) female who presents with chief complaint of  Chief Complaint  Patient presents with   Follow-up    6 month carotid ultrasound follow up  .  History of Present Illness: Patient returns in follow-up of her carotid disease.  She is about 1 year status post right carotid stent placement for high-grade stenosis.  She is doing well.  She has had 2 more coronary stents placed since her last visit.  She does continue to smoke.  No focal neurologic symptoms. Specifically, the patient denies amaurosis fugax, speech or swallowing difficulties, or arm or leg weakness or numbness.  Carotid duplex today reveals a widely patent right carotid artery stent and stable 1 to 39% left ICA stenosis.  Current Outpatient Medications  Medication Sig Dispense Refill   acetaminophen (TYLENOL) 500 MG tablet Take 1,000 mg by mouth every 6 (six) hours as needed (for pain.).     aspirin EC 81 MG tablet Take 81 mg by mouth daily. Swallow whole.     Blood Glucose Monitoring Suppl KIT 1 kit by Does not apply route daily as needed. 1 kit 0   BRILINTA 90 MG TABS tablet Take 90 mg by mouth 2 (two) times daily.     Continuous Blood Gluc Sensor (FREESTYLE LIBRE 2 SENSOR) MISC USE 1 KIT EVERY 14 (FOURTEEN) DAYS FOR GLUCOSE MONITORING     dicyclomine (BENTYL) 10 MG capsule Take 1 capsule (10 mg total) by mouth 3 (three) times daily as needed for spasms. 30 capsule 0   metFORMIN (GLUCOPHAGE-XR) 500 MG 24 hr tablet Take 2,000 mg by mouth every evening.     nicotine (NICODERM CQ - DOSED IN MG/24 HOURS) 14 mg/24hr patch Place 1 patch (14 mg total) onto the skin daily. 28 patch 0   nitroGLYCERIN (NITROSTAT) 0.4 MG SL tablet Place 1 tablet (0.4 mg total) under the tongue every 5 (five) minutes x 3 doses as needed for chest pain. 100 tablet 0   SEMGLEE, YFGN, 100 UNIT/ML Pen Inject 50 Units into the skin at bedtime.     atorvastatin (LIPITOR) 80 MG tablet Take 1 tablet (80 mg  total) by mouth daily. 30 tablet 0   clopidogrel (PLAVIX) 75 MG tablet TAKE 1 TABLET BY MOUTH EVERY DAY (Patient not taking: Reported on 06/28/2023) 90 tablet 1   metoprolol succinate (TOPROL-XL) 25 MG 24 hr tablet Take 0.5 tablets (12.5 mg total) by mouth daily. 15 tablet 0   No current facility-administered medications for this visit.    Past Medical History:  Diagnosis Date   Cholelithiases    Diabetes (HCC)    GERD (gastroesophageal reflux disease)    due to gallbladder-no meds   Hypertension    off meds x 2 years-better control now   Myocardial infarction [facility])     Past Surgical History:  Procedure Laterality Date   CAROTID PTA/STENT INTERVENTION Right 06/07/2022   Procedure: CAROTID PTA/STENT INTERVENTION;  Surgeon: Annice Needy, MD;  Location: [facility] INVASIVE CV LAB;  Service: Cardiovascular;  Laterality: Right;   CESAREAN SECTION     x2   CORONARY/GRAFT ACUTE MI REVASCULARIZATION N/A 02/09/2023   Procedure: Coronary/Graft Acute MI Revascularization;  Surgeon: Marcina Millard, MD;  Location: [facility] INVASIVE CV LAB;  Service: Cardiovascular;  Laterality: N/A;   LEFT HEART CATH AND CORONARY ANGIOGRAPHY N/A 02/09/2023   Procedure: LEFT HEART CATH AND CORONARY ANGIOGRAPHY;  Surgeon: Marcina Millard, MD;  Location: [facility] INVASIVE CV LAB;  Service: Cardiovascular;  Laterality: N/A;   WISDOM TOOTH EXTRACTION       Social History   Tobacco Use   Smoking status: Every Day    Current packs/day: 1.00    Average packs/day: 1 pack/day for 30.0 years (30.0 ttl pk-yrs)    Types: Cigarettes   Smokeless tobacco: Never  Vaping Use   Vaping status: Never Used  Substance Use Topics   Alcohol use: Never   Drug use: Never       Family History  Problem Relation Age of Onset   COPD Mother    Other Father        COVID     Allergies  Allergen Reactions   Keflex [Cephalexin] Hives, Itching and Dermatitis    Received Benadryl   Lisinopril Cough   Losartan Other (See  Comments)    Blurred vision   Oxycodone-Acetaminophen Other (See Comments)    Severe pain while taking      REVIEW OF SYSTEMS (Negative unless checked)  Constitutional: [] Weight loss  [] Fever  [] Chills Cardiac: [x] Chest pain   [] Chest pressure   [] Palpitations   [] Shortness of breath when laying flat   [] Shortness of breath at rest   [] Shortness of breath with exertion. Vascular:  [] Pain in legs with walking   [] Pain in legs at rest   [] Pain in legs when laying flat   [] Claudication   [] Pain in feet when walking  [] Pain in feet at rest  [] Pain in feet when laying flat   [] History of DVT   [] Phlebitis   [] Swelling in legs   [] Varicose veins   [] Non-healing ulcers Pulmonary:   [] Uses home oxygen   [] Productive cough   [] Hemoptysis   [] Wheeze  [] COPD   [] Asthma Neurologic:  [] Dizziness  [] Blackouts   [] Seizures   [] History of stroke   [] History of TIA  [] Aphasia   [] Temporary blindness   [] Dysphagia   [] Weakness or numbness in arms   [] Weakness or numbness in legs Musculoskeletal:  [] Arthritis   [] Joint swelling   [] Joint pain   [] Low back pain Hematologic:  [] Easy bruising  [] Easy bleeding   [] Hypercoagulable state   [] Anemic  [] Hepatitis Gastrointestinal:  [] Blood in stool   [] Vomiting blood  [x] Gastroesophageal reflux/heartburn   [] Difficulty swallowing. Genitourinary:  [] Chronic kidney disease   [] Difficult urination  [] Frequent urination  [] Burning with urination   [] Blood in urine Skin:  [] Rashes   [] Ulcers   [] Wounds Psychological:  [] History of anxiety   []  History of major depression.  Physical Examination  Vitals:   06/28/23 0847  BP: 131/64  Pulse: 75  Resp: 16  Weight: 192 lb 3.2 oz (87.2 kg)   Body mass index is 31.5 kg/m. Gen:  WD/WN, NAD Head: [place]/AT, No temporalis wasting. Ear/Nose/Throat: Hearing grossly intact, nares w/o erythema or drainage, trachea midline Eyes: Conjunctiva clear. Sclera non-icteric Neck: Supple.  No bruit  Pulmonary:  Good air movement, equal and  clear to auscultation bilaterally.  Cardiac: RRR, No JVD Vascular:  Vessel Right Left  Radial Palpable Palpable           Musculoskeletal: M/S 5/5 throughout.  No deformity or atrophy. No edema. Neurologic: CN 2-12 intact. Sensation grossly intact in extremities.  Symmetrical.  Speech is fluent. Motor exam as listed above. Psychiatric: Judgment intact, Mood & affect appropriate for pt's clinical situation. Dermatologic: No rashes or ulcers noted.  No cellulitis or open wounds. Lymph : No Cervical, Axillary, or Inguinal lymphadenopathy.    CBC Lab Results  Component Value Date   WBC 13.2 (H) 02/10/2023   HGB 12.5 02/10/2023   HCT 38.7 02/10/2023   MCV 87.0 02/10/2023   PLT 324 02/10/2023    BMET    Component Value Date/Time   NA 134 (L) 02/10/2023 0359   K 4.0 02/10/2023 0359   CL 105 02/10/2023 0359   CO2 19 (L) 02/10/2023 0359   GLUCOSE 314 (H) 02/10/2023 0359   BUN 14 02/10/2023 0359   CREATININE 0.59 02/10/2023 0359   CALCIUM 8.1 (L) 02/10/2023 0359   GFRNONAA >60 02/10/2023 0359   CrCl cannot be calculated (Patient's most recent lab result is older than the maximum 21 days allowed.).  COAG Lab Results  Component Value Date   INR 1.0 02/09/2023    Radiology No results found.   Assessment/Plan Carotid stenosis Carotid duplex today reveals a widely patent right carotid artery stent and stable 1 to 39% left ICA stenosis.  She is back on dual antiplatelet therapy after her cardiac stent placement.  We can follow her on an annual basis at this point.  Essential hypertension, benign blood pressure control important in reducing the progression of atherosclerotic disease. On appropriate oral medications.     Diabetes (HCC) blood glucose control important in reducing the progression of atherosclerotic disease. Also, involved in wound healing. On appropriate medications.     Hyperlipidemia lipid control important in reducing the progression of atherosclerotic  disease. Continue statin therapy  Festus Barren, MD  06/28/2023 9:27 AM    This note was created with Dragon medical transcription system.  Any errors from dictation are purely unintentional

## 2023-06-28 NOTE — Assessment & Plan Note (Signed)
Carotid duplex today reveals a widely patent right carotid artery stent and stable 1 to 39% left ICA stenosis.  She is back on dual antiplatelet therapy after her cardiac stent placement.  We can follow her on an annual basis at this point.

## 2024-01-17 ENCOUNTER — Encounter (INDEPENDENT_AMBULATORY_CARE_PROVIDER_SITE_OTHER): Payer: Self-pay

## 2024-06-28 ENCOUNTER — Other Ambulatory Visit (INDEPENDENT_AMBULATORY_CARE_PROVIDER_SITE_OTHER): Payer: Self-pay | Admitting: Vascular Surgery

## 2024-06-28 DIAGNOSIS — I6521 Occlusion and stenosis of right carotid artery: Secondary | ICD-10-CM

## 2024-06-29 ENCOUNTER — Encounter (INDEPENDENT_AMBULATORY_CARE_PROVIDER_SITE_OTHER): Payer: Self-pay | Admitting: Vascular Surgery

## 2024-06-29 ENCOUNTER — Ambulatory Visit (INDEPENDENT_AMBULATORY_CARE_PROVIDER_SITE_OTHER): Payer: Managed Care, Other (non HMO) | Admitting: Vascular Surgery

## 2024-06-29 ENCOUNTER — Ambulatory Visit (INDEPENDENT_AMBULATORY_CARE_PROVIDER_SITE_OTHER): Payer: Managed Care, Other (non HMO)

## 2024-06-29 ENCOUNTER — Other Ambulatory Visit: Payer: Self-pay | Admitting: Neurology

## 2024-06-29 VITALS — BP 132/66 | HR 72 | Resp 18 | Ht 65.0 in | Wt 183.4 lb

## 2024-06-29 DIAGNOSIS — E785 Hyperlipidemia, unspecified: Secondary | ICD-10-CM | POA: Diagnosis not present

## 2024-06-29 DIAGNOSIS — G500 Trigeminal neuralgia: Secondary | ICD-10-CM

## 2024-06-29 DIAGNOSIS — E119 Type 2 diabetes mellitus without complications: Secondary | ICD-10-CM

## 2024-06-29 DIAGNOSIS — I10 Essential (primary) hypertension: Secondary | ICD-10-CM | POA: Diagnosis not present

## 2024-06-29 DIAGNOSIS — I6521 Occlusion and stenosis of right carotid artery: Secondary | ICD-10-CM

## 2024-06-29 NOTE — Progress Notes (Signed)
 MRN : [id number]  Lauren Whitney is a 58 y.o. ([birth year]) female who presents with chief complaint of  Chief Complaint  Patient presents with   Follow-up    1 year follow up carotid  .  History of Present Illness: Patient returns in follow-up of her carotid disease.  She is about 2 years status post right carotid stent placement.  She is doing well.  No focal neurologic symptoms. Specifically, the patient denies amaurosis fugax, speech or swallowing difficulties, or arm or leg weakness or numbness.  Carotid duplex today reveals a widely patent right carotid stent and stable 1 to 39% left ICA stenosis.  Current Outpatient Medications  Medication Sig Dispense Refill   acetaminophen  (TYLENOL ) 500 MG tablet Take 1,000 mg by mouth every 6 (six) hours as needed (for pain.).     aspirin  EC 81 MG tablet Take 81 mg by mouth daily. Swallow whole.     atorvastatin  (LIPITOR ) 80 MG tablet Take 1 tablet (80 mg total) by mouth daily. 30 tablet 0   Blood Glucose Monitoring Suppl KIT 1 kit by Does not apply route daily as needed. 1 kit 0   BRILINTA  90 MG TABS tablet Take 90 mg by mouth 2 (two) times daily.     Continuous Blood Gluc Sensor (FREESTYLE LIBRE 2 SENSOR) MISC USE 1 KIT EVERY 14 (FOURTEEN) DAYS FOR GLUCOSE MONITORING     dicyclomine  (BENTYL ) 10 MG capsule Take 1 capsule (10 mg total) by mouth 3 (three) times daily as needed for spasms. 30 capsule 0   metFORMIN (GLUCOPHAGE-XR) 500 MG 24 hr tablet Take 2,000 mg by mouth every evening.     metoprolol  succinate (TOPROL -XL) 25 MG 24 hr tablet Take 0.5 tablets (12.5 mg total) by mouth daily. 15 tablet 0   nitroGLYCERIN  (NITROSTAT ) 0.4 MG SL tablet Place 1 tablet (0.4 mg total) under the tongue every 5 (five) minutes x 3 doses as needed for chest pain. 100 tablet 0   clopidogrel  (PLAVIX ) 75 MG tablet TAKE 1 TABLET BY MOUTH EVERY DAY (Patient not taking: Reported on 06/28/2023) 90 tablet 1   nicotine  (NICODERM CQ  - DOSED IN MG/24 HOURS) 14 mg/24hr  patch Place 1 patch (14 mg total) onto the skin daily. 28 patch 0   SEMGLEE , YFGN, 100 UNIT/ML Pen Inject 50 Units into the skin at bedtime. (Patient not taking: Reported on 06/29/2024)     No current facility-administered medications for this visit.    Past Medical History:  Diagnosis Date   Cholelithiases    Diabetes (HCC)    GERD (gastroesophageal reflux disease)    due to gallbladder-no meds   Hypertension    off meds x 2 years-better control now   Myocardial infarction [facility])     Past Surgical History:  Procedure Laterality Date   CAROTID PTA/STENT INTERVENTION Right 06/07/2022   Procedure: CAROTID PTA/STENT INTERVENTION;  Surgeon: Marea Selinda RAMAN, MD;  Location: [facility] IN[facility]IVE [facility];  Service: Cardiovascular;  Laterality: Right;   CESAREAN SECTION     x2   CORONARY/GRAFT ACUTE MI REVASCULARIZATION N/A 02/09/2023   Procedure: Coronary/Graft Acute MI Revascularization;  Surgeon: Ammon Blunt, MD;  Location: [facility];  Service: Cardiovascular;  Laterality: N/A;   LEFT HEART CATH AND CORONARY ANGIOGRAPHY N/A 02/09/2023   Procedure: LEFT HEART CATH AND CORONARY ANGIOGRAPHY;  Surgeon: Ammon Blunt, MD;  Location: [facility];  Service: Cardiovascular;  Laterality: N/A;   WISDOM TOOTH EXTRACTION       Social  History   Tobacco Use   Smoking status: Every Day    Current packs/day: 1.00    Average packs/day: 1 pack/day for 30.0 years (30.0 ttl pk-yrs)    Types: Cigarettes   Smokeless tobacco: Never  Vaping Use   Vaping status: Never Used  Substance Use Topics   Alcohol use: Never   Drug use: Never       Family History  Problem Relation Age of Onset   COPD Mother    Other Father        COVID     Allergies  Allergen Reactions   Keflex [Cephalexin] Hives, Itching and Dermatitis    Received Benadryl    Lisinopril Cough   Losartan Other (See Comments)    Blurred vision   Oxycodone -Acetaminophen  Other (See Comments)    Severe pain  while taking      REVIEW OF SYSTEMS (Negative unless checked)   Constitutional: [] Weight loss  [] Fever  [] Chills Cardiac: [x] Chest pain   [] Chest pressure   [] Palpitations   [] Shortness of breath when laying flat   [] Shortness of breath at rest   [] Shortness of breath with exertion. Vascular:  [] Pain in legs with walking   [] Pain in legs at rest   [] Pain in legs when laying flat   [] Claudication   [] Pain in feet when walking  [] Pain in feet at rest  [] Pain in feet when laying flat   [] History of DVT   [] Phlebitis   [] Swelling in legs   [] Varicose veins   [] Non-healing ulcers Pulmonary:   [] Uses home oxygen   [] Productive cough   [] Hemoptysis   [] Wheeze  [] COPD   [] Asthma Neurologic:  [] Dizziness  [] Blackouts   [] Seizures   [] History of stroke   [] History of TIA  [] Aphasia   [] Temporary blindness   [] Dysphagia   [] Weakness or numbness in arms   [] Weakness or numbness in legs Musculoskeletal:  [] Arthritis   [] Joint swelling   [] Joint pain   [] Low back pain Hematologic:  [] Easy bruising  [] Easy bleeding   [] Hypercoagulable state   [] Anemic  [] Hepatitis Gastrointestinal:  [] Blood in stool   [] Vomiting blood  [x] Gastroesophageal reflux/heartburn   [] Difficulty swallowing. Genitourinary:  [] Chronic kidney disease   [] Difficult urination  [] Frequent urination  [] Burning with urination   [] Blood in urine Skin:  [] Rashes   [] Ulcers   [] Wounds Psychological:  [] History of anxiety   []  History of major depression.  Physical Examination  Vitals:   06/29/24 0833  BP: 132/66  Pulse: 72  Resp: 18  Weight: 183 lb 6.4 oz (83.2 kg)  Height: 5' 5 (1.651 m)   Body mass index is 30.52 kg/m. Gen:  WD/WN, NAD Head: [place]/AT, No temporalis wasting. Ear/Nose/Throat: Hearing grossly intact, nares w/o erythema or drainage, trachea midline Eyes: Conjunctiva clear. Sclera non-icteric Neck: Supple.  No bruit  Pulmonary:  Good air movement, equal and clear to auscultation bilaterally.  Cardiac: RRR, No  JVD Vascular:  Vessel Right Left  Radial Palpable Palpable       Musculoskeletal: M/S 5/5 throughout.  No deformity or atrophy. No edema. Neurologic: CN 2-12 intact. Sensation grossly intact in extremities.  Symmetrical.  Speech is fluent. Motor exam as listed above. Psychiatric: Judgment intact, Mood & affect appropriate for pt's clinical situation. Dermatologic: No rashes or ulcers noted.  No cellulitis or open wounds.     CBC Lab Results  Component Value Date   WBC 13.2 (H) 02/10/2023   HGB 12.5 02/10/2023   HCT 38.7 02/10/2023  MCV 87.0 02/10/2023   PLT 324 02/10/2023    BMET    Component Value Date/Time   NA 134 (L) 02/10/2023 0359   K 4.0 02/10/2023 0359   CL 105 02/10/2023 0359   CO2 19 (L) 02/10/2023 0359   GLUCOSE 314 (H) 02/10/2023 0359   BUN 14 02/10/2023 0359   CREATININE 0.59 02/10/2023 0359   CALCIUM  8.1 (L) 02/10/2023 0359   GFRNONAA >60 02/10/2023 0359   CrCl cannot be calculated (Patient's most recent lab result is older than the maximum 21 days allowed.).  COAG Lab Results  Component Value Date   INR 1.0 02/09/2023    Radiology VAS US  CAROTID Result Date: 06/29/2024 Carotid Arterial Duplex Study Patient Name:  Lauren Whitney  Date of Exam:   06/29/2024 Medical Rec #: [id number]          Accession #:    [id number] Date of Birth: April 04, 1966          Patient Gender: F Patient Age:   17 years Exam Location:  [facility] Procedure:      VAS US  CAROTID Referring Phys: SELINDA GU --------------------------------------------------------------------------------  Indications:       Carotid artery disease. Other Factors:     06/07/2022 Right ICA stent placed. Comparison Study:  06/28/2023 Performing Technologist: Leafy Gibes RVS  Examination Guidelines: A complete evaluation includes B-mode imaging, spectral Doppler, color Doppler, and power Doppler as needed of all accessible portions of each vessel. Bilateral testing is considered an integral  part of a complete examination. Limited examinations for reoccurring indications may be performed as noted.  Right Carotid Findings: +----------+--------+--------+--------+------------------+--------+           PSV cm/sEDV cm/sStenosisPlaque DescriptionComments +----------+--------+--------+--------+------------------+--------+ CCA Prox  84      19                                         +----------+--------+--------+--------+------------------+--------+ CCA Mid   70      18                                         +----------+--------+--------+--------+------------------+--------+ CCA Distal75      20                                         +----------+--------+--------+--------+------------------+--------+ ICA Prox  63      15                                         +----------+--------+--------+--------+------------------+--------+ ICA Mid   64      17                                         +----------+--------+--------+--------+------------------+--------+ ICA Distal71      20                                         +----------+--------+--------+--------+------------------+--------+ ECA  177     29                                         +----------+--------+--------+--------+------------------+--------+ +----------+--------+-------+--------+-------------------+           PSV cm/sEDV cmsDescribeArm Pressure (mmHG) +----------+--------+-------+--------+-------------------+ Subclavian121     0                                  +----------+--------+-------+--------+-------------------+ +---------+--------+--+--------+-+ VertebralPSV cm/s40EDV cm/s9 +---------+--------+--+--------+-+  Left Carotid Findings: +----------+--------+--------+--------+------------------+--------+           PSV cm/sEDV cm/sStenosisPlaque DescriptionComments +----------+--------+--------+--------+------------------+--------+ CCA Prox  125     24                                          +----------+--------+--------+--------+------------------+--------+ CCA Mid   115     25                                         +----------+--------+--------+--------+------------------+--------+ CCA Distal106     24                                         +----------+--------+--------+--------+------------------+--------+ ICA Prox  89      26                                         +----------+--------+--------+--------+------------------+--------+ ICA Mid   95      24                                         +----------+--------+--------+--------+------------------+--------+ ICA Distal90      23                                         +----------+--------+--------+--------+------------------+--------+ ECA       111     0                                          +----------+--------+--------+--------+------------------+--------+ +----------+--------+--------+--------+-------------------+           PSV cm/sEDV cm/sDescribeArm Pressure (mmHG) +----------+--------+--------+--------+-------------------+ [id number]     0                                   +----------+--------+--------+--------+-------------------+ +---------+--------+--+--------+--+ VertebralPSV cm/s78EDV cm/s14 +---------+--------+--+--------+--+   Summary: Right Carotid: Velocities in the right ICA are consistent with a 1-39% stenosis. Left Carotid: Velocities in the left ICA are consistent with a 1-39% stenosis.  *See table(s) above for measurements and observations.  Electronically signed by Selinda Gu MD on 06/29/2024 at 8:45:38 AM.  Final      Assessment/Plan Carotid stenosis, right Carotid duplex today reveals a widely patent right carotid stent and stable 1 to 39% left ICA stenosis.  She is on aspirin  and a statin agent as well as Brilinta .  No changes.  Follow-up in 1 year.  Essential hypertension, benign blood pressure control important  in reducing the progression of atherosclerotic disease. On appropriate oral medications.     Diabetes (HCC) blood glucose control important in reducing the progression of atherosclerotic disease. Also, involved in wound healing. On appropriate medications.     Hyperlipidemia lipid control important in reducing the progression of atherosclerotic disease. Continue statin therapy  Selinda Gu, MD  06/29/2024 9:18 AM    This note was created with Dragon medical transcription system.  Any errors from dictation are purely unintentional

## 2024-06-29 NOTE — Assessment & Plan Note (Signed)
 Carotid duplex today reveals a widely patent right carotid stent and stable 1 to 39% left ICA stenosis.  She is on aspirin  and a statin agent as well as Brilinta .  No changes.  Follow-up in 1 year.

## 2024-07-16 ENCOUNTER — Ambulatory Visit
Admission: RE | Admit: 2024-07-16 | Discharge: 2024-07-16 | Disposition: A | Discharge status: Home / Self Care | Source: Ambulatory Visit | Attending: Neurology | Admitting: Neurology

## 2024-07-16 DIAGNOSIS — G500 Trigeminal neuralgia: Secondary | ICD-10-CM

## 2024-07-16 MED ORDER — GADOPICLENOL 0.5 MMOL/ML IV SOLN
10.0000 mL | Freq: Once | INTRAVENOUS | Status: AC | PRN
  Administered 2024-07-16: 9 mL via INTRAVENOUS

## 2025-06-28 ENCOUNTER — Encounter (INDEPENDENT_AMBULATORY_CARE_PROVIDER_SITE_OTHER)

## 2025-06-28 ENCOUNTER — Ambulatory Visit (INDEPENDENT_AMBULATORY_CARE_PROVIDER_SITE_OTHER): Admitting: Vascular Surgery
